# Patient Record
Sex: Female | Born: 1990 | Race: Black or African American | Hispanic: No | Marital: Single | Smoking: Never smoker
Health system: Southern US, Community
[De-identification: ages and names within clinical notes are randomized; demographics above are authoritative.]

## PROBLEM LIST (undated history)

## (undated) ENCOUNTER — Inpatient Hospital Stay (HOSPITAL_COMMUNITY): Payer: Self-pay

## (undated) DIAGNOSIS — O24419 Gestational diabetes mellitus in pregnancy, unspecified control: Secondary | ICD-10-CM

## (undated) DIAGNOSIS — D62 Acute posthemorrhagic anemia: Secondary | ICD-10-CM

## (undated) HISTORY — PX: NO PAST SURGERIES: SHX2092

## (undated) HISTORY — PX: TOOTH EXTRACTION: SUR596

---

## 2015-10-26 DIAGNOSIS — N898 Other specified noninflammatory disorders of vagina: Secondary | ICD-10-CM | POA: Diagnosis not present

## 2015-10-26 DIAGNOSIS — Z7689 Persons encountering health services in other specified circumstances: Secondary | ICD-10-CM | POA: Diagnosis not present

## 2015-10-26 DIAGNOSIS — A749 Chlamydial infection, unspecified: Secondary | ICD-10-CM | POA: Diagnosis not present

## 2015-10-26 DIAGNOSIS — N760 Acute vaginitis: Secondary | ICD-10-CM | POA: Diagnosis not present

## 2015-10-26 DIAGNOSIS — Z01419 Encounter for gynecological examination (general) (routine) without abnormal findings: Secondary | ICD-10-CM | POA: Diagnosis not present

## 2015-10-26 DIAGNOSIS — Z3201 Encounter for pregnancy test, result positive: Secondary | ICD-10-CM | POA: Diagnosis not present

## 2015-10-26 DIAGNOSIS — Z124 Encounter for screening for malignant neoplasm of cervix: Secondary | ICD-10-CM | POA: Diagnosis not present

## 2015-10-26 DIAGNOSIS — B9689 Other specified bacterial agents as the cause of diseases classified elsewhere: Secondary | ICD-10-CM | POA: Diagnosis not present

## 2015-10-26 DIAGNOSIS — Z3A01 Less than 8 weeks gestation of pregnancy: Secondary | ICD-10-CM | POA: Diagnosis not present

## 2015-10-26 DIAGNOSIS — Z7251 High risk heterosexual behavior: Secondary | ICD-10-CM | POA: Diagnosis not present

## 2015-10-26 DIAGNOSIS — Z6841 Body Mass Index (BMI) 40.0 and over, adult: Secondary | ICD-10-CM | POA: Diagnosis not present

## 2015-11-05 DIAGNOSIS — Z3201 Encounter for pregnancy test, result positive: Secondary | ICD-10-CM | POA: Diagnosis not present

## 2015-11-23 DIAGNOSIS — Z36 Encounter for antenatal screening of mother: Secondary | ICD-10-CM | POA: Diagnosis not present

## 2015-11-23 DIAGNOSIS — Z3401 Encounter for supervision of normal first pregnancy, first trimester: Secondary | ICD-10-CM | POA: Diagnosis not present

## 2015-12-30 DIAGNOSIS — Z36 Encounter for antenatal screening of mother: Secondary | ICD-10-CM | POA: Diagnosis not present

## 2016-01-12 DIAGNOSIS — O26892 Other specified pregnancy related conditions, second trimester: Secondary | ICD-10-CM | POA: Diagnosis not present

## 2016-01-12 DIAGNOSIS — R1084 Generalized abdominal pain: Secondary | ICD-10-CM | POA: Diagnosis not present

## 2016-01-12 DIAGNOSIS — R300 Dysuria: Secondary | ICD-10-CM | POA: Diagnosis not present

## 2016-01-17 DIAGNOSIS — R0789 Other chest pain: Secondary | ICD-10-CM | POA: Diagnosis not present

## 2016-01-17 DIAGNOSIS — S01512A Laceration without foreign body of oral cavity, initial encounter: Secondary | ICD-10-CM | POA: Diagnosis not present

## 2016-01-17 DIAGNOSIS — Z3A19 19 weeks gestation of pregnancy: Secondary | ICD-10-CM | POA: Diagnosis not present

## 2016-01-17 DIAGNOSIS — O9A212 Injury, poisoning and certain other consequences of external causes complicating pregnancy, second trimester: Secondary | ICD-10-CM | POA: Diagnosis not present

## 2016-01-17 DIAGNOSIS — M545 Low back pain: Secondary | ICD-10-CM | POA: Diagnosis not present

## 2016-01-17 DIAGNOSIS — O9989 Other specified diseases and conditions complicating pregnancy, childbirth and the puerperium: Secondary | ICD-10-CM | POA: Diagnosis not present

## 2016-01-17 DIAGNOSIS — Z6841 Body Mass Index (BMI) 40.0 and over, adult: Secondary | ICD-10-CM | POA: Diagnosis not present

## 2016-01-29 DIAGNOSIS — Z3402 Encounter for supervision of normal first pregnancy, second trimester: Secondary | ICD-10-CM | POA: Diagnosis not present

## 2016-01-29 DIAGNOSIS — Z36 Encounter for antenatal screening of mother: Secondary | ICD-10-CM | POA: Diagnosis not present

## 2016-02-12 DIAGNOSIS — Z36 Encounter for antenatal screening of mother: Secondary | ICD-10-CM | POA: Diagnosis not present

## 2016-02-23 DIAGNOSIS — Z3A23 23 weeks gestation of pregnancy: Secondary | ICD-10-CM | POA: Diagnosis not present

## 2016-02-23 DIAGNOSIS — O368990 Maternal care for other specified fetal problems, unspecified trimester, not applicable or unspecified: Secondary | ICD-10-CM | POA: Diagnosis not present

## 2016-03-30 DIAGNOSIS — Z36 Encounter for antenatal screening of mother: Secondary | ICD-10-CM | POA: Diagnosis not present

## 2016-04-01 DIAGNOSIS — Z3A29 29 weeks gestation of pregnancy: Secondary | ICD-10-CM | POA: Diagnosis not present

## 2016-04-01 DIAGNOSIS — O3663X0 Maternal care for excessive fetal growth, third trimester, not applicable or unspecified: Secondary | ICD-10-CM | POA: Diagnosis not present

## 2016-04-06 DIAGNOSIS — Z3A30 30 weeks gestation of pregnancy: Secondary | ICD-10-CM | POA: Diagnosis not present

## 2016-04-06 DIAGNOSIS — O99810 Abnormal glucose complicating pregnancy: Secondary | ICD-10-CM | POA: Diagnosis not present

## 2016-04-11 ENCOUNTER — Encounter: Payer: 59 | Attending: Obstetrics and Gynecology | Admitting: Skilled Nursing Facility1

## 2016-04-11 VITALS — Ht 60.0 in | Wt 223.0 lb

## 2016-04-11 DIAGNOSIS — O24410 Gestational diabetes mellitus in pregnancy, diet controlled: Secondary | ICD-10-CM

## 2016-04-11 DIAGNOSIS — Z713 Dietary counseling and surveillance: Secondary | ICD-10-CM | POA: Diagnosis not present

## 2016-04-12 ENCOUNTER — Encounter: Payer: Self-pay | Admitting: Skilled Nursing Facility1

## 2016-04-12 NOTE — Progress Notes (Signed)
  Patient was seen on 04/12/2016 for Gestational Diabetes self-management class at the [facility]. The following learning objectives were met by the patient during this course:   States the definition of Gestational Diabetes  States why dietary management is important in controlling blood glucose  Describes the effects each nutrient has on blood glucose levels  Demonstrates ability to create a balanced meal plan  Demonstrates carbohydrate counting   States when to check blood glucose levels involving a total of 4 separate occurences in a day  Demonstrates proper blood glucose monitoring techniques  States the effect of stress and exercise on blood glucose levels  States the importance of limiting caffeine and abstaining from alcohol and smoking  Demonstrates the knowledge the glucometer provided in class may not be covered by their insurance and to call their insurance provider immediately after class to know which glucometer their insurance provider does cover as well as calling their physician the next day for a prescription to the glucometer their insurance does cover (if the one provided is not) as well as the lancets and strips for that meter.  Blood glucose monitor given: true metrix Lot # [id number] Exp: 10/26/2016 Blood glucose reading: WNL fasting  Patient instructed to monitor glucose levels: FBS: 60 - <90 1 hour: <140 2 hour: <120  *Patient received handouts:  Nutrition Diabetes and Pregnancy  Carbohydrate Counting List  Patient will be seen for follow-up as needed.

## 2016-04-24 ENCOUNTER — Encounter (HOSPITAL_COMMUNITY): Payer: Self-pay

## 2016-04-24 ENCOUNTER — Inpatient Hospital Stay (HOSPITAL_COMMUNITY)
Admission: AD | Admit: 2016-04-24 | Discharge: 2016-04-24 | Disposition: A | Discharge status: Home / Self Care | Payer: 59 | Source: Ambulatory Visit | Attending: Obstetrics and Gynecology | Admitting: Obstetrics and Gynecology

## 2016-04-24 DIAGNOSIS — R55 Syncope and collapse: Secondary | ICD-10-CM | POA: Insufficient documentation

## 2016-04-24 DIAGNOSIS — O133 Gestational [pregnancy-induced] hypertension without significant proteinuria, third trimester: Secondary | ICD-10-CM | POA: Insufficient documentation

## 2016-04-24 DIAGNOSIS — Z3A32 32 weeks gestation of pregnancy: Secondary | ICD-10-CM | POA: Insufficient documentation

## 2016-04-24 DIAGNOSIS — O24419 Gestational diabetes mellitus in pregnancy, unspecified control: Secondary | ICD-10-CM | POA: Diagnosis not present

## 2016-04-24 HISTORY — DX: Gestational diabetes mellitus in pregnancy, unspecified control: O24.419

## 2016-04-24 LAB — COMPREHENSIVE METABOLIC PANEL
ALK PHOS: 87 U/L (ref 38–126)
ALT: 14 U/L (ref 14–54)
AST: 19 U/L (ref 15–41)
Albumin: 2.9 g/dL — ABNORMAL LOW (ref 3.5–5.0)
Anion gap: 9 (ref 5–15)
BUN: 7 mg/dL (ref 6–20)
CALCIUM: 9.3 mg/dL (ref 8.9–10.3)
CO2: 21 mmol/L — ABNORMAL LOW (ref 22–32)
CREATININE: 0.48 mg/dL (ref 0.44–1.00)
Chloride: 103 mmol/L (ref 101–111)
Glucose, Bld: 116 mg/dL — ABNORMAL HIGH (ref 65–99)
Potassium: 3.7 mmol/L (ref 3.5–5.1)
Sodium: 133 mmol/L — ABNORMAL LOW (ref 135–145)
TOTAL PROTEIN: 6.5 g/dL (ref 6.5–8.1)
Total Bilirubin: 0.5 mg/dL (ref 0.3–1.2)

## 2016-04-24 LAB — CBC
HCT: 31.2 % — ABNORMAL LOW (ref 36.0–46.0)
HEMOGLOBIN: 10.2 g/dL — AB (ref 12.0–15.0)
MCH: 27.5 pg (ref 26.0–34.0)
MCHC: 32.7 g/dL (ref 30.0–36.0)
MCV: 84.1 fL (ref 78.0–100.0)
PLATELETS: 349 10*3/uL (ref 150–400)
RBC: 3.71 MIL/uL — AB (ref 3.87–5.11)
RDW: 13.9 % (ref 11.5–15.5)
WBC: 10.6 10*3/uL — ABNORMAL HIGH (ref 4.0–10.5)

## 2016-04-24 LAB — URINALYSIS, ROUTINE W REFLEX MICROSCOPIC
Bilirubin Urine: NEGATIVE
Glucose, UA: 250 mg/dL — AB
HGB URINE DIPSTICK: NEGATIVE
Ketones, ur: 15 mg/dL — AB
Leukocytes, UA: NEGATIVE
NITRITE: NEGATIVE
PROTEIN: 30 mg/dL — AB
Specific Gravity, Urine: 1.025 (ref 1.005–1.030)
pH: 6 (ref 5.0–8.0)

## 2016-04-24 LAB — URINE MICROSCOPIC-ADD ON: RBC / HPF: NONE SEEN RBC/hpf (ref 0–5)

## 2016-04-24 LAB — PROTEIN / CREATININE RATIO, URINE
CREATININE, URINE: 255 mg/dL
Protein Creatinine Ratio: 0.17 mg/mg{Cre} — ABNORMAL HIGH (ref 0.00–0.15)
TOTAL PROTEIN, URINE: 43 mg/dL

## 2016-04-24 LAB — GLUCOSE, CAPILLARY: Glucose-Capillary: 118 mg/dL — ABNORMAL HIGH (ref 65–99)

## 2016-04-24 NOTE — MAU Provider Note (Signed)
History     Chief Complaint  Patient presents with  . Loss of Consciousness   25 yo G1P0 BF @ 32 4/7 weeks presents witnessed  LOC x 1 min today while standing up in church. Per pt's family member, her eyes were still open. She denies any h/a, visual changes, palpitation, seizure activity or loss of urine/stool. Good FM. Denies abdominal trauma. Pt reports prior hx of LOC while not pregnant with negative workup including EEG per pt. Pt checked her surgery and it was 129. PNC complicated by Class a1 GDM  OB History    Gravida Para Term Preterm AB Living   1             SAB TAB Ectopic Multiple Live Births                  Past Medical History:  Diagnosis Date  . Gestational diabetes mellitus, antepartum, unspecified diabetic control, unspecified trimester     Past Surgical History:  Procedure Laterality Date  . TOOTH EXTRACTION      History reviewed. No pertinent family history.  Social History  Substance Use Topics  . Smoking status: Never Smoker  . Smokeless tobacco: Never Used  . Alcohol use No    Allergies: Not on File  No prescriptions prior to admission.     Physical Exam   Blood pressure 145/87, pulse 107, temperature 97.5 F (36.4 C), temperature source Oral, resp. rate 16.  General appearance: alert, cooperative and no distress Lungs: clear to auscultation bilaterally Heart: tachycardia. Grade 2/6 SEM Abdomen: gravid soft non tender Pelvic: deferred Extremities: edema tr- 1+ Pulses: 2+ and symmetric Skin: Skin color, texture, turgor normal. No rashes or lesions   Tracing: baseline 125 (+) accel  IMP: LOC prob vasovagal  Gest HTN IUP @ 32 4/7 weeks GDM clss a1 P) ekg, pih labs.NST ED Course   Addendum: tracing: baseline 125 (+) accel to 150 No ctx  EKG: sinus tachycardia CBC Latest Ref Rng & Units 04/24/2016  WBC 4.0 - 10.5 K/uL 10.6(H)  Hemoglobin 12.0 - 15.0 g/dL 10.2(L)  Hematocrit 36.0 - 46.0 % 31.2(L)  Platelets 150 - 400 K/uL 349    CMP Latest Ref Rng & Units 04/24/2016  Glucose 65 - 99 mg/dL 220(U)  BUN 6 - 20 mg/dL 7  Creatinine 5.42 - 7.06 mg/dL 2.37  Sodium 628 - 315 mmol/L 133(L)  Potassium 3.5 - 5.1 mmol/L 3.7  Chloride 101 - 111 mmol/L 103  CO2 22 - 32 mmol/L 21(L)  Calcium 8.9 - 10.3 mg/dL 9.3  Total Protein 6.5 - 8.1 g/dL 6.5  Total Bilirubin 0.3 - 1.2 mg/dL 0.5  Alkaline Phos 38 - 126 U/L 87  AST 15 - 41 U/L 19  ALT 14 - 54 U/L 14  protein/creatinine ratio 0.17  MDM   Gatlyn Lipari A, MD 3:38 PM 04/24/2016

## 2016-04-24 NOTE — MAU Note (Signed)
Pt was at home, stood up and got light headed and passed out.  Friends say she did fall but did not hit her abdomen.

## 2016-05-05 DIAGNOSIS — O24410 Gestational diabetes mellitus in pregnancy, diet controlled: Secondary | ICD-10-CM | POA: Diagnosis not present

## 2016-05-05 DIAGNOSIS — Z3A34 34 weeks gestation of pregnancy: Secondary | ICD-10-CM | POA: Diagnosis not present

## 2016-05-12 DIAGNOSIS — O24414 Gestational diabetes mellitus in pregnancy, insulin controlled: Secondary | ICD-10-CM | POA: Diagnosis not present

## 2016-05-12 DIAGNOSIS — Z3A35 35 weeks gestation of pregnancy: Secondary | ICD-10-CM | POA: Diagnosis not present

## 2016-05-18 DIAGNOSIS — Z3A36 36 weeks gestation of pregnancy: Secondary | ICD-10-CM | POA: Diagnosis not present

## 2016-05-18 DIAGNOSIS — O24414 Gestational diabetes mellitus in pregnancy, insulin controlled: Secondary | ICD-10-CM | POA: Diagnosis not present

## 2016-05-24 DIAGNOSIS — Z3A36 36 weeks gestation of pregnancy: Secondary | ICD-10-CM | POA: Diagnosis not present

## 2016-05-24 DIAGNOSIS — O24414 Gestational diabetes mellitus in pregnancy, insulin controlled: Secondary | ICD-10-CM | POA: Diagnosis not present

## 2016-05-27 DIAGNOSIS — Z3A37 37 weeks gestation of pregnancy: Secondary | ICD-10-CM | POA: Diagnosis not present

## 2016-05-27 DIAGNOSIS — O24414 Gestational diabetes mellitus in pregnancy, insulin controlled: Secondary | ICD-10-CM | POA: Diagnosis not present

## 2016-06-01 DIAGNOSIS — Z3A38 38 weeks gestation of pregnancy: Secondary | ICD-10-CM | POA: Diagnosis not present

## 2016-06-01 DIAGNOSIS — O24414 Gestational diabetes mellitus in pregnancy, insulin controlled: Secondary | ICD-10-CM | POA: Diagnosis not present

## 2016-06-06 ENCOUNTER — Encounter (HOSPITAL_COMMUNITY): Payer: Self-pay

## 2016-06-06 ENCOUNTER — Inpatient Hospital Stay (HOSPITAL_COMMUNITY)
Admission: AD | Admit: 2016-06-06 | Discharge: 2016-06-11 | DRG: 765 | Disposition: A | Discharge status: Home / Self Care | Payer: 59 | Source: Ambulatory Visit | Attending: Obstetrics and Gynecology | Admitting: Obstetrics and Gynecology

## 2016-06-06 ENCOUNTER — Inpatient Hospital Stay (HOSPITAL_COMMUNITY): Payer: 59

## 2016-06-06 DIAGNOSIS — Z3A38 38 weeks gestation of pregnancy: Secondary | ICD-10-CM

## 2016-06-06 DIAGNOSIS — O99214 Obesity complicating childbirth: Secondary | ICD-10-CM | POA: Diagnosis present

## 2016-06-06 DIAGNOSIS — D62 Acute posthemorrhagic anemia: Secondary | ICD-10-CM | POA: Diagnosis not present

## 2016-06-06 DIAGNOSIS — O134 Gestational [pregnancy-induced] hypertension without significant proteinuria, complicating childbirth: Secondary | ICD-10-CM | POA: Diagnosis present

## 2016-06-06 DIAGNOSIS — O9081 Anemia of the puerperium: Secondary | ICD-10-CM | POA: Diagnosis not present

## 2016-06-06 DIAGNOSIS — O24424 Gestational diabetes mellitus in childbirth, insulin controlled: Secondary | ICD-10-CM | POA: Diagnosis not present

## 2016-06-06 DIAGNOSIS — L2989 Other pruritus: Secondary | ICD-10-CM | POA: Diagnosis not present

## 2016-06-06 DIAGNOSIS — Z6841 Body Mass Index (BMI) 40.0 and over, adult: Secondary | ICD-10-CM | POA: Diagnosis not present

## 2016-06-06 DIAGNOSIS — O4103X0 Oligohydramnios, third trimester, not applicable or unspecified: Principal | ICD-10-CM | POA: Diagnosis present

## 2016-06-06 DIAGNOSIS — O99824 Streptococcus B carrier state complicating childbirth: Secondary | ICD-10-CM | POA: Diagnosis present

## 2016-06-06 DIAGNOSIS — Z3A39 39 weeks gestation of pregnancy: Secondary | ICD-10-CM | POA: Diagnosis not present

## 2016-06-06 DIAGNOSIS — O24425 Gestational diabetes mellitus in childbirth, controlled by oral hypoglycemic drugs: Secondary | ICD-10-CM | POA: Diagnosis present

## 2016-06-06 DIAGNOSIS — O139 Gestational [pregnancy-induced] hypertension without significant proteinuria, unspecified trimester: Secondary | ICD-10-CM | POA: Diagnosis present

## 2016-06-06 DIAGNOSIS — O24419 Gestational diabetes mellitus in pregnancy, unspecified control: Secondary | ICD-10-CM | POA: Diagnosis present

## 2016-06-06 DIAGNOSIS — T50905A Adverse effect of unspecified drugs, medicaments and biological substances, initial encounter: Secondary | ICD-10-CM

## 2016-06-06 DIAGNOSIS — O368130 Decreased fetal movements, third trimester, not applicable or unspecified: Secondary | ICD-10-CM | POA: Diagnosis not present

## 2016-06-06 DIAGNOSIS — O24414 Gestational diabetes mellitus in pregnancy, insulin controlled: Secondary | ICD-10-CM | POA: Diagnosis not present

## 2016-06-06 DIAGNOSIS — O4100X0 Oligohydramnios, unspecified trimester, not applicable or unspecified: Secondary | ICD-10-CM | POA: Diagnosis present

## 2016-06-06 DIAGNOSIS — L298 Other pruritus: Secondary | ICD-10-CM | POA: Diagnosis not present

## 2016-06-06 HISTORY — DX: Acute posthemorrhagic anemia: D62

## 2016-06-06 LAB — CBC
HCT: 31.9 % — ABNORMAL LOW (ref 36.0–46.0)
Hemoglobin: 10.6 g/dL — ABNORMAL LOW (ref 12.0–15.0)
MCH: 26.2 pg (ref 26.0–34.0)
MCHC: 33.2 g/dL (ref 30.0–36.0)
MCV: 78.8 fL (ref 78.0–100.0)
PLATELETS: 333 10*3/uL (ref 150–400)
RBC: 4.05 MIL/uL (ref 3.87–5.11)
RDW: 15.9 % — ABNORMAL HIGH (ref 11.5–15.5)
WBC: 10.5 10*3/uL (ref 4.0–10.5)

## 2016-06-06 LAB — OB RESULTS CONSOLE ABO/RH: RH TYPE: POSITIVE

## 2016-06-06 LAB — OB RESULTS CONSOLE HEPATITIS B SURFACE ANTIGEN: HEP B S AG: NEGATIVE

## 2016-06-06 LAB — OB RESULTS CONSOLE GBS: GBS: POSITIVE

## 2016-06-06 LAB — OB RESULTS CONSOLE RUBELLA ANTIBODY, IGM: RUBELLA: IMMUNE

## 2016-06-06 LAB — OB RESULTS CONSOLE RPR: RPR: NONREACTIVE

## 2016-06-06 LAB — OB RESULTS CONSOLE GC/CHLAMYDIA
Chlamydia: NEGATIVE
Gonorrhea: NEGATIVE

## 2016-06-06 LAB — OB RESULTS CONSOLE HIV ANTIBODY (ROUTINE TESTING): HIV: NONREACTIVE

## 2016-06-06 LAB — OB RESULTS CONSOLE ANTIBODY SCREEN: Antibody Screen: NEGATIVE

## 2016-06-06 LAB — GLUCOSE, RANDOM: Glucose, Bld: 105 mg/dL — ABNORMAL HIGH (ref 65–99)

## 2016-06-06 MED ORDER — ACETAMINOPHEN 325 MG PO TABS: 650.0000 mg | ORAL_TABLET | ORAL | Status: DC | PRN

## 2016-06-06 MED ORDER — OXYCODONE-ACETAMINOPHEN 5-325 MG PO TABS
2.0000 | ORAL_TABLET | ORAL | Status: DC | PRN
Start: 2016-06-06 — End: 2016-06-08

## 2016-06-06 MED ORDER — SOD CITRATE-CITRIC ACID 500-334 MG/5ML PO SOLN
30.0000 mL | ORAL | Status: DC | PRN
  Administered 2016-06-08: 30 mL via ORAL
  Filled 2016-06-06: qty 15

## 2016-06-06 MED ORDER — OXYTOCIN 40 UNITS IN LACTATED RINGERS INFUSION - SIMPLE MED: 2.5000 [IU]/h | INTRAVENOUS | Status: DC

## 2016-06-06 MED ORDER — OXYTOCIN 10 UNIT/ML IJ SOLN: 10.0000 [IU] | Freq: Once | INTRAMUSCULAR | Status: DC

## 2016-06-06 MED ORDER — PENICILLIN G POTASSIUM 5000000 UNITS IJ SOLR
5.0000 10*6.[IU] | Freq: Once | INTRAVENOUS | Status: AC
  Administered 2016-06-06: 5 10*6.[IU] via INTRAVENOUS
  Filled 2016-06-06: qty 5

## 2016-06-06 MED ORDER — TERBUTALINE SULFATE 1 MG/ML IJ SOLN
0.2500 mg | Freq: Once | INTRAMUSCULAR | Status: DC | PRN
Start: 2016-06-06 — End: 2016-06-08

## 2016-06-06 MED ORDER — OXYCODONE-ACETAMINOPHEN 5-325 MG PO TABS: 1.0000 | ORAL_TABLET | ORAL | Status: DC | PRN

## 2016-06-06 MED ORDER — LIDOCAINE HCL (PF) 1 % IJ SOLN: 30.0000 mL | INTRAMUSCULAR | Status: DC | PRN

## 2016-06-06 MED ORDER — LACTATED RINGERS IV SOLN: 500.0000 mL | INTRAVENOUS | Status: DC | PRN

## 2016-06-06 MED ORDER — PENICILLIN G POTASSIUM 5000000 UNITS IJ SOLR
2.5000 10*6.[IU] | INTRAVENOUS | Status: DC
  Administered 2016-06-07 – 2016-06-08 (×10): 2.5 10*6.[IU] via INTRAVENOUS
  Filled 2016-06-06 (×13): qty 2.5

## 2016-06-06 MED ORDER — MISOPROSTOL 25 MCG QUARTER TABLET
25.0000 ug | ORAL_TABLET | ORAL | Status: DC | PRN
  Administered 2016-06-06: 25 ug via VAGINAL
  Filled 2016-06-06: qty 0.25

## 2016-06-06 MED ORDER — ONDANSETRON HCL 4 MG/2ML IJ SOLN: 4.0000 mg | Freq: Four times a day (QID) | INTRAMUSCULAR | Status: DC | PRN

## 2016-06-06 MED ORDER — LACTATED RINGERS IV SOLN
INTRAVENOUS | Status: DC
  Administered 2016-06-06 – 2016-06-08 (×8): via INTRAVENOUS

## 2016-06-06 MED ORDER — OXYTOCIN BOLUS FROM INFUSION: 500.0000 mL | Freq: Once | INTRAVENOUS | Status: DC

## 2016-06-06 NOTE — MAU Note (Signed)
Urine in lab 

## 2016-06-06 NOTE — H&P (Signed)
Michelle [name] is a 25 y.o. female presenting for IOL @ 38 5/[redacted] weeks gestation due to newly dx oligohydramnios( AFI 3.1) down from 7.6 cm( 9/6). Denies rupture of membrane. (+) FM. NR NST. BPP 8/8 PNC also complicated by Class a2 GDM on glyburide  OB History    Gravida Para Term Preterm AB Living   1             SAB TAB Ectopic Multiple Live Births                 Past Medical History:  Diagnosis Date  . Gestational diabetes mellitus, antepartum, unspecified diabetic control, unspecified trimester    Gestational   Past Surgical History:  Procedure Laterality Date  . TOOTH EXTRACTION     Family History: family history is not on file. Social History:  reports that she has never smoked. She has never used smokeless tobacco. She reports that she does not drink alcohol or use drugs.     Maternal Diabetes: Yes:  Diabetes Type:  Insulin/Medication controlled Genetic Screening: Normal Maternal Ultrasounds/Referrals: Normal Fetal Ultrasounds or other Referrals:  None Maternal Substance Abuse:  No Significant Maternal Medications:  Meds include: Other: glyburide Significant Maternal Lab Results:  Lab values include: Group B Strep positive Other Comments:  oligohydramnios  Review of Systems  All other systems reviewed and are negative.  Maternal Medical History:  Contractions: Frequency: rare.    Fetal activity: Perceived fetal activity is normal.    Prenatal complications: Oligohydramnios.   Prenatal Complications - Diabetes: gestational. Diabetes is managed by oral agent (triple therapy).        Blood pressure 142/88, pulse 102, temperature 98 F (36.7 C), temperature source Oral, resp. rate 18, height 5' (1.524 m), weight 106.6 kg (235 lb). Maternal Exam:  Uterine Assessment: Contraction frequency is rare.   Abdomen: Fetal presentation: vertex  Introitus: Normal vulva. Pelvis: adequate for delivery.   Cervix: Cervix evaluated by digital exam.     Physical  Exam  Constitutional: She appears well-nourished.  HENT:  Head: Atraumatic.  Eyes: EOM are normal.  Neck: Neck supple.  Cardiovascular: Regular rhythm.   Respiratory: Breath sounds normal.  GI: Soft.  Musculoskeletal: She exhibits edema.  Neurological: She is alert.  Skin: Skin is warm and dry.  Psychiatric: She has a normal mood and affect.   VE closed/uneffaced/-3 Prenatal labs: ABO, Rh:  A positive Antibody:  neg Rubella:  Immune RPR:   NR HBsAg:  neg  HIV:   neg GBS:  positive   Assessment/Plan: Oligohydramnios Class a2 GDM Term gestation (+) GBS P) admit routine labs. Cytotec. IV PCN. Analgesic prn.  CBG q 2 hrs. Light labor diet   Marline Morace A 06/06/2016, 6:19 PM

## 2016-06-06 NOTE — MAU Note (Signed)
Pt presents to MAU from Dr. Cherly Hensenousins office for a BPP and NST. Pt reports baby was not as active in the NST earlier today. Pt reports she does feel fetal movement. Pt was closed in the office.

## 2016-06-06 NOTE — MAU Provider Note (Signed)
History     Chief Complaint  Patient presents with  . Non-stress Test  cc: 25 yo G1P0 BF with Class A2 GDM on glyburide sent from office for further testing due to NR NST    OB History    Gravida Para Term Preterm AB Living   1             SAB TAB Ectopic Multiple Live Births                  Past Medical History:  Diagnosis Date  . Gestational diabetes mellitus, antepartum, unspecified diabetic control, unspecified trimester    Gestational    Past Surgical History:  Procedure Laterality Date  . TOOTH EXTRACTION      History reviewed. No pertinent family history.  Social History  Substance Use Topics  . Smoking status: Never Smoker  . Smokeless tobacco: Never Used  . Alcohol use No    Allergies:  Allergies  Allergen Reactions  . Ibuprofen Hives    Prescriptions Prior to Admission  Medication Sig Dispense Refill Last Dose  . glyBURIDE (DIABETA) 2.5 MG tablet Take 2.5 mg by mouth 2 (two) times daily with [name] meal.   06/06/2016 at Unknown time  . Prenatal Vit-Fe Fumarate-FA (PRENATAL MULTIVITAMIN) TABS tablet Take 1 tablet by mouth daily at 12 noon.   06/06/2016 at Unknown time     Physical Exam   Blood pressure 142/88, pulse 102, temperature 98 F (36.7 C), temperature source Oral, resp. rate 18, height 5' (1.524 m), weight 106.6 kg (235 lb).  No exam performed today, here for testing.  tracing: baseline 135 (+) accel (+) variable x1 No ctx  sono done: BPP 8/8.AFI 3.2 ED Course  IMP: oligohydramnios Class A2GDM Term gestation  P) admit for IOL  With unfavorable cervix , will do Cytotec. See admit H&P  MDM   Michelle [name] A, MD 6:21 PM 06/06/2016

## 2016-06-07 LAB — GLUCOSE, CAPILLARY
GLUCOSE-CAPILLARY: 134 mg/dL — AB (ref 65–99)
GLUCOSE-CAPILLARY: 98 mg/dL (ref 65–99)
GLUCOSE-CAPILLARY: 88 mg/dL (ref 65–99)
Glucose-Capillary: 97 mg/dL (ref 65–99)
Glucose-Capillary: 83 mg/dL (ref 65–99)

## 2016-06-07 LAB — CBC
HEMATOCRIT: 33.2 % — AB (ref 36.0–46.0)
Hemoglobin: 10.9 g/dL — ABNORMAL LOW (ref 12.0–15.0)
MCH: 26 pg (ref 26.0–34.0)
MCHC: 32.8 g/dL (ref 30.0–36.0)
MCV: 79 fL (ref 78.0–100.0)
Platelets: 335 10*3/uL (ref 150–400)
RBC: 4.2 MIL/uL (ref 3.87–5.11)
RDW: 16 % — AB (ref 11.5–15.5)
WBC: 18.3 10*3/uL — AB (ref 4.0–10.5)

## 2016-06-07 LAB — RPR: RPR: NONREACTIVE

## 2016-06-07 MED ORDER — OXYTOCIN 40 UNITS IN LACTATED RINGERS INFUSION - SIMPLE MED
1.0000 m[IU]/min | INTRAVENOUS | Status: DC
  Administered 2016-06-07: 2 m[IU]/min via INTRAVENOUS
  Administered 2016-06-08: 22 m[IU]/min via INTRAVENOUS
  Filled 2016-06-07 (×2): qty 1000

## 2016-06-07 MED ORDER — PHENYLEPHRINE 40 MCG/ML (10ML) SYRINGE FOR IV PUSH (FOR BLOOD PRESSURE SUPPORT): 80.0000 ug | PREFILLED_SYRINGE | INTRAVENOUS | Status: DC | PRN

## 2016-06-07 MED ORDER — BUTORPHANOL TARTRATE 1 MG/ML IJ SOLN
2.0000 mg | INTRAMUSCULAR | Status: DC | PRN
  Administered 2016-06-07: 2 mg via INTRAVENOUS
  Filled 2016-06-07: qty 2

## 2016-06-07 MED ORDER — EPHEDRINE 5 MG/ML INJ: 10.0000 mg | INTRAVENOUS | Status: DC | PRN

## 2016-06-07 MED ORDER — TERBUTALINE SULFATE 1 MG/ML IJ SOLN: 0.2500 mg | Freq: Once | INTRAMUSCULAR | Status: DC | PRN

## 2016-06-07 MED ORDER — PHENYLEPHRINE 40 MCG/ML (10ML) SYRINGE FOR IV PUSH (FOR BLOOD PRESSURE SUPPORT)
80.0000 ug | PREFILLED_SYRINGE | INTRAVENOUS | Status: DC | PRN
  Filled 2016-06-07: qty 10

## 2016-06-07 MED ORDER — DIPHENHYDRAMINE HCL 50 MG/ML IJ SOLN: 12.5000 mg | INTRAMUSCULAR | Status: DC | PRN

## 2016-06-07 MED ORDER — LACTATED RINGERS IV SOLN
500.0000 mL | Freq: Once | INTRAVENOUS | Status: AC
  Administered 2016-06-08: 500 mL via INTRAVENOUS

## 2016-06-07 MED ORDER — FENTANYL 2.5 MCG/ML BUPIVACAINE 1/10 % EPIDURAL INFUSION (WH - ANES)
14.0000 mL/h | INTRAMUSCULAR | Status: DC | PRN
  Administered 2016-06-08: 14 mL/h via EPIDURAL
  Filled 2016-06-07: qty 125

## 2016-06-07 NOTE — Progress Notes (Signed)
S; did not sleep well S/p cytotec x 1  O; Pitocin  VE ft ext os/long/-3  Attempted balloon placement w/o success  Tracing: baseline 135 (+) accel Ctx q 2-3 mins  IMP: Oligohydramnios GBS cx (+) Class A2 GDM  P)  Cont with pitocin. IV PCN

## 2016-06-07 NOTE — Progress Notes (Signed)
S; drowsy due to stadol  O:  Pitocin 18 miu VE . Balloon in place. Thin cervix  Tracing: baseline 125 (+) accel Ctx q 2-4  BS 86  IMP: oligohydramnios GBS cx (+)  On IV PCN Class A2 GDM P) cont present mgmt.

## 2016-06-07 NOTE — Progress Notes (Signed)
Michelle [name] is a 25 y.o. G1P0 at 6858w6d by ultrasound admitted for induction of labor due to Low amniotic fluid..  Subjective: no complaint. Some cramping Notes some cramping  Objective: Pitocin 14miu  BP 139/90   Pulse 91   Temp 98.3 F (36.8 C) (Oral)   Resp 20   Ht 5' (1.524 m)   Wt 106.6 kg (235 lb)   BMI 45.90 kg/m  No intake/output data recorded. Total I/O In: 240 [P.O.:240] Out: -   FHT:  FHR: 135 bpm, variability: moderate,  accelerations:  Present,  decelerations:  Absent UC:   irregular, every ? frequency minutes  VE tight 1cm/50/-3 IUPC placed CBG (last 3)   Recent Labs  06/07/16 0103 06/07/16 0820 06/07/16 1203  GLUCAP 134* 97 83     Labs: Lab Results  Component Value Date   WBC 10.5 06/06/2016   HGB 10.6 (L) 06/06/2016   HCT 31.9 (L) 06/06/2016   MCV 78.8 06/06/2016   PLT 333 06/06/2016    Assessment / Plan: oligohydramnios  Class A2GDM GBS cx (+) P) cont pitocin. Await foley balloon bag out   Anticipated MOD:  guarded  Marily Konczal A 06/07/2016, 1:51 PM

## 2016-06-08 ENCOUNTER — Encounter (HOSPITAL_COMMUNITY): Payer: Self-pay | Admitting: Anesthesiology

## 2016-06-08 ENCOUNTER — Encounter (HOSPITAL_COMMUNITY)
Admission: AD | Disposition: A | Discharge status: Home / Self Care | Payer: Self-pay | Source: Ambulatory Visit | Attending: Obstetrics and Gynecology

## 2016-06-08 ENCOUNTER — Inpatient Hospital Stay (HOSPITAL_COMMUNITY): Payer: 59 | Admitting: Anesthesiology

## 2016-06-08 LAB — COMPREHENSIVE METABOLIC PANEL
ALBUMIN: 2.8 g/dL — AB (ref 3.5–5.0)
ALT: 13 U/L — ABNORMAL LOW (ref 14–54)
ANION GAP: 7 (ref 5–15)
AST: 19 U/L (ref 15–41)
Alkaline Phosphatase: 133 U/L — ABNORMAL HIGH (ref 38–126)
BILIRUBIN TOTAL: 0.7 mg/dL (ref 0.3–1.2)
BUN: 5 mg/dL — ABNORMAL LOW (ref 6–20)
CHLORIDE: 106 mmol/L (ref 101–111)
CO2: 22 mmol/L (ref 22–32)
Calcium: 9.4 mg/dL (ref 8.9–10.3)
Creatinine, Ser: 0.43 mg/dL — ABNORMAL LOW (ref 0.44–1.00)
GFR calc Af Amer: >60 mL/min (ref >60)
Glucose, Bld: 118 mg/dL — ABNORMAL HIGH (ref 65–99)
POTASSIUM: 3.8 mmol/L (ref 3.5–5.1)
Sodium: 135 mmol/L (ref 135–145)
TOTAL PROTEIN: 6.9 g/dL (ref 6.5–8.1)

## 2016-06-08 LAB — CBC
HCT: 27.5 % — ABNORMAL LOW (ref 36.0–46.0)
HEMATOCRIT: 31.4 % — AB (ref 36.0–46.0)
Hemoglobin: 10.2 g/dL — ABNORMAL LOW (ref 12.0–15.0)
Hemoglobin: 9 g/dL — ABNORMAL LOW (ref 12.0–15.0)
MCH: 25.5 pg — ABNORMAL LOW (ref 26.0–34.0)
MCH: 25.5 pg — AB (ref 26.0–34.0)
MCHC: 32.5 g/dL (ref 30.0–36.0)
MCHC: 32.7 g/dL (ref 30.0–36.0)
MCV: 78.5 fL (ref 78.0–100.0)
MCV: 77.9 fL — AB (ref 78.0–100.0)
PLATELETS: 317 10*3/uL (ref 150–400)
PLATELETS: 232 10*3/uL (ref 150–400)
RBC: 4 MIL/uL (ref 3.87–5.11)
RBC: 3.53 MIL/uL — ABNORMAL LOW (ref 3.87–5.11)
RDW: 16.1 % — AB (ref 11.5–15.5)
RDW: 16.2 % — AB (ref 11.5–15.5)
WBC: 14.4 10*3/uL — ABNORMAL HIGH (ref 4.0–10.5)
WBC: 23.2 10*3/uL — ABNORMAL HIGH (ref 4.0–10.5)

## 2016-06-08 LAB — GLUCOSE, CAPILLARY
GLUCOSE-CAPILLARY: 111 mg/dL — AB (ref 65–99)
GLUCOSE-CAPILLARY: 115 mg/dL — AB (ref 65–99)
GLUCOSE-CAPILLARY: 118 mg/dL — AB (ref 65–99)
GLUCOSE-CAPILLARY: 98 mg/dL (ref 65–99)
Glucose-Capillary: 114 mg/dL — ABNORMAL HIGH (ref 65–99)
Glucose-Capillary: 109 mg/dL — ABNORMAL HIGH (ref 65–99)

## 2016-06-08 LAB — URIC ACID: URIC ACID, SERUM: 5.6 mg/dL (ref 2.3–6.6)

## 2016-06-08 SURGERY — Surgical Case
Anesthesia: Epidural

## 2016-06-08 MED ORDER — NALBUPHINE HCL 10 MG/ML IJ SOLN: 5.0000 mg | Freq: Once | INTRAMUSCULAR | Status: DC | PRN

## 2016-06-08 MED ORDER — BUPIVACAINE HCL (PF) 0.25 % IJ SOLN
INTRAMUSCULAR | Status: DC | PRN
  Administered 2016-06-08: 30 mL

## 2016-06-08 MED ORDER — DEXAMETHASONE SODIUM PHOSPHATE 4 MG/ML IJ SOLN
INTRAMUSCULAR | Status: AC
  Filled 2016-06-08: qty 1

## 2016-06-08 MED ORDER — DIPHENHYDRAMINE HCL 25 MG PO CAPS: 25.0000 mg | ORAL_CAPSULE | Freq: Four times a day (QID) | ORAL | Status: DC | PRN

## 2016-06-08 MED ORDER — MORPHINE SULFATE (PF) 0.5 MG/ML IJ SOLN
INTRAMUSCULAR | Status: AC
  Filled 2016-06-08: qty 10

## 2016-06-08 MED ORDER — SODIUM BICARBONATE 8.4 % IV SOLN
INTRAVENOUS | Status: DC | PRN
  Administered 2016-06-08 (×2): 5 mL via EPIDURAL

## 2016-06-08 MED ORDER — DIPHENHYDRAMINE HCL 50 MG/ML IJ SOLN: 12.5000 mg | INTRAMUSCULAR | Status: DC | PRN

## 2016-06-08 MED ORDER — NALBUPHINE HCL 10 MG/ML IJ SOLN: 5.0000 mg | INTRAMUSCULAR | Status: DC | PRN

## 2016-06-08 MED ORDER — MEPERIDINE HCL 25 MG/ML IJ SOLN: 6.2500 mg | INTRAMUSCULAR | Status: DC | PRN

## 2016-06-08 MED ORDER — OXYTOCIN 10 UNIT/ML IJ SOLN
INTRAMUSCULAR | Status: AC
Start: 2016-06-08 — End: 2016-06-08
  Filled 2016-06-08: qty 4

## 2016-06-08 MED ORDER — SIMETHICONE 80 MG PO CHEW
80.0000 mg | CHEWABLE_TABLET | Freq: Three times a day (TID) | ORAL | Status: DC
  Administered 2016-06-09 – 2016-06-11 (×7): 80 mg via ORAL
  Filled 2016-06-08 (×7): qty 1

## 2016-06-08 MED ORDER — PHENYLEPHRINE 40 MCG/ML (10ML) SYRINGE FOR IV PUSH (FOR BLOOD PRESSURE SUPPORT)
PREFILLED_SYRINGE | INTRAVENOUS | Status: AC
  Filled 2016-06-08: qty 10

## 2016-06-08 MED ORDER — CARBOPROST TROMETHAMINE 250 MCG/ML IM SOLN
INTRAMUSCULAR | Status: DC | PRN
  Administered 2016-06-08: 250 ug via INTRAMUSCULAR

## 2016-06-08 MED ORDER — NALOXONE HCL 2 MG/2ML IJ SOSY: 1.0000 ug/kg/h | PREFILLED_SYRINGE | INTRAMUSCULAR | Status: DC | PRN

## 2016-06-08 MED ORDER — FENTANYL CITRATE (PF) 100 MCG/2ML IJ SOLN
INTRAMUSCULAR | Status: AC
  Filled 2016-06-08: qty 2

## 2016-06-08 MED ORDER — CEFAZOLIN SODIUM-DEXTROSE 2-3 GM-% IV SOLR
INTRAVENOUS | Status: DC | PRN
  Administered 2016-06-08: 2 g via INTRAVENOUS

## 2016-06-08 MED ORDER — DIBUCAINE 1 % RE OINT: 1.0000 "application " | TOPICAL_OINTMENT | RECTAL | Status: DC | PRN

## 2016-06-08 MED ORDER — CARBOPROST TROMETHAMINE 250 MCG/ML IM SOLN: 250.0000 ug | Freq: Once | INTRAMUSCULAR | Status: DC

## 2016-06-08 MED ORDER — SCOPOLAMINE 1 MG/3DAYS TD PT72
MEDICATED_PATCH | TRANSDERMAL | Status: DC | PRN
  Administered 2016-06-08: 1 via TRANSDERMAL

## 2016-06-08 MED ORDER — CEFAZOLIN SODIUM-DEXTROSE 2-4 GM/100ML-% IV SOLN
INTRAVENOUS | Status: AC
  Filled 2016-06-08: qty 100

## 2016-06-08 MED ORDER — ZOLPIDEM TARTRATE 5 MG PO TABS: 5.0000 mg | ORAL_TABLET | Freq: Every evening | ORAL | Status: DC | PRN

## 2016-06-08 MED ORDER — ONDANSETRON HCL 4 MG/2ML IJ SOLN
INTRAMUSCULAR | Status: AC
  Filled 2016-06-08: qty 2

## 2016-06-08 MED ORDER — ONDANSETRON HCL 4 MG/2ML IJ SOLN: 4.0000 mg | Freq: Three times a day (TID) | INTRAMUSCULAR | Status: DC | PRN

## 2016-06-08 MED ORDER — PHENYLEPHRINE 40 MCG/ML (10ML) SYRINGE FOR IV PUSH (FOR BLOOD PRESSURE SUPPORT)
PREFILLED_SYRINGE | INTRAVENOUS | Status: AC
  Filled 2016-06-08: qty 20

## 2016-06-08 MED ORDER — LIDOCAINE HCL (PF) 1 % IJ SOLN
INTRAMUSCULAR | Status: DC | PRN
  Administered 2016-06-08 (×2): 7 mL via EPIDURAL

## 2016-06-08 MED ORDER — SODIUM CHLORIDE 0.9 % IR SOLN
Status: DC | PRN
  Administered 2016-06-08 (×4): 1

## 2016-06-08 MED ORDER — LIDOCAINE-EPINEPHRINE (PF) 2 %-1:200000 IJ SOLN
INTRAMUSCULAR | Status: AC
Start: 2016-06-08 — End: 2016-06-08
  Filled 2016-06-08: qty 20

## 2016-06-08 MED ORDER — ONDANSETRON HCL 4 MG/2ML IJ SOLN: 4.0000 mg | Freq: Once | INTRAMUSCULAR | Status: DC | PRN

## 2016-06-08 MED ORDER — ACETAMINOPHEN 500 MG PO TABS
1000.0000 mg | ORAL_TABLET | Freq: Four times a day (QID) | ORAL | Status: AC
  Administered 2016-06-09 (×4): 1000 mg via ORAL
  Filled 2016-06-08 (×4): qty 2

## 2016-06-08 MED ORDER — DEXAMETHASONE SODIUM PHOSPHATE 4 MG/ML IJ SOLN
INTRAMUSCULAR | Status: DC | PRN
  Administered 2016-06-08: 4 mg via INTRAVENOUS

## 2016-06-08 MED ORDER — SCOPOLAMINE 1 MG/3DAYS TD PT72: 1.0000 | MEDICATED_PATCH | Freq: Once | TRANSDERMAL | Status: DC

## 2016-06-08 MED ORDER — MORPHINE SULFATE (PF) 0.5 MG/ML IJ SOLN
INTRAMUSCULAR | Status: DC | PRN
  Administered 2016-06-08: 3 mg via EPIDURAL

## 2016-06-08 MED ORDER — BUPIVACAINE HCL (PF) 0.25 % IJ SOLN
INTRAMUSCULAR | Status: AC
  Filled 2016-06-08: qty 30

## 2016-06-08 MED ORDER — OXYCODONE-ACETAMINOPHEN 5-325 MG PO TABS: 2.0000 | ORAL_TABLET | ORAL | Status: DC | PRN

## 2016-06-08 MED ORDER — SIMETHICONE 80 MG PO CHEW: 80.0000 mg | CHEWABLE_TABLET | ORAL | Status: DC | PRN

## 2016-06-08 MED ORDER — MENTHOL 3 MG MT LOZG: 1.0000 | LOZENGE | OROMUCOSAL | Status: DC | PRN

## 2016-06-08 MED ORDER — WITCH HAZEL-GLYCERIN EX PADS: 1.0000 "application " | MEDICATED_PAD | CUTANEOUS | Status: DC | PRN

## 2016-06-08 MED ORDER — NALOXONE HCL 0.4 MG/ML IJ SOLN: 0.4000 mg | INTRAMUSCULAR | Status: DC | PRN

## 2016-06-08 MED ORDER — SODIUM CHLORIDE 0.9% FLUSH: 3.0000 mL | INTRAVENOUS | Status: DC | PRN

## 2016-06-08 MED ORDER — COCONUT OIL OIL: 1.0000 "application " | TOPICAL_OIL | Status: DC | PRN

## 2016-06-08 MED ORDER — OXYTOCIN 10 UNIT/ML IJ SOLN
INTRAVENOUS | Status: DC | PRN
  Administered 2016-06-08: 40 [IU] via INTRAVENOUS

## 2016-06-08 MED ORDER — OXYTOCIN 40 UNITS IN LACTATED RINGERS INFUSION - SIMPLE MED: 2.5000 [IU]/h | INTRAVENOUS | Status: DC

## 2016-06-08 MED ORDER — SENNOSIDES-DOCUSATE SODIUM 8.6-50 MG PO TABS
2.0000 | ORAL_TABLET | ORAL | Status: DC
  Administered 2016-06-09 – 2016-06-10 (×3): 2 via ORAL
  Filled 2016-06-08 (×3): qty 2

## 2016-06-08 MED ORDER — PHENYLEPHRINE 40 MCG/ML (10ML) SYRINGE FOR IV PUSH (FOR BLOOD PRESSURE SUPPORT)
PREFILLED_SYRINGE | INTRAVENOUS | Status: AC
  Filled 2016-06-08: qty 30

## 2016-06-08 MED ORDER — OXYCODONE-ACETAMINOPHEN 5-325 MG PO TABS
1.0000 | ORAL_TABLET | ORAL | Status: DC | PRN
  Administered 2016-06-10 – 2016-06-11 (×4): 1 via ORAL
  Filled 2016-06-08 (×4): qty 1

## 2016-06-08 MED ORDER — ONDANSETRON HCL 4 MG/2ML IJ SOLN
INTRAMUSCULAR | Status: DC | PRN
  Administered 2016-06-08: 4 mg via INTRAVENOUS

## 2016-06-08 MED ORDER — PHENYLEPHRINE HCL 10 MG/ML IJ SOLN
INTRAMUSCULAR | Status: DC | PRN
  Administered 2016-06-08 (×4): 80 ug via INTRAVENOUS
  Administered 2016-06-08 (×3): 40 ug via INTRAVENOUS
  Administered 2016-06-08: 80 ug via INTRAVENOUS
  Administered 2016-06-08: 40 ug via INTRAVENOUS

## 2016-06-08 MED ORDER — SIMETHICONE 80 MG PO CHEW
80.0000 mg | CHEWABLE_TABLET | ORAL | Status: DC
  Administered 2016-06-09 – 2016-06-10 (×2): 80 mg via ORAL
  Filled 2016-06-08 (×3): qty 1

## 2016-06-08 MED ORDER — DIPHENHYDRAMINE HCL 25 MG PO CAPS: 25.0000 mg | ORAL_CAPSULE | ORAL | Status: DC | PRN

## 2016-06-08 MED ORDER — LACTATED RINGERS IV SOLN
INTRAVENOUS | Status: DC | PRN
  Administered 2016-06-08 (×2): via INTRAVENOUS

## 2016-06-08 MED ORDER — LACTATED RINGERS IV SOLN
INTRAVENOUS | Status: DC
  Administered 2016-06-09: 01:00:00 via INTRAVENOUS

## 2016-06-08 MED ORDER — FENTANYL CITRATE (PF) 100 MCG/2ML IJ SOLN
25.0000 ug | INTRAMUSCULAR | Status: DC | PRN
Start: 2016-06-08 — End: 2016-06-08
  Administered 2016-06-08: 25 ug via INTRAVENOUS

## 2016-06-08 MED ORDER — PRENATAL MULTIVITAMIN CH
1.0000 | ORAL_TABLET | Freq: Every day | ORAL | Status: DC
  Administered 2016-06-09 – 2016-06-10 (×2): 1 via ORAL
  Filled 2016-06-08 (×3): qty 1

## 2016-06-08 SURGICAL SUPPLY — 43 items
BARRIER ADHS 3X4 INTERCEED (GAUZE/BANDAGES/DRESSINGS) ×2 IMPLANT
BENZOIN TINCTURE PRP APPL 2/3 (GAUZE/BANDAGES/DRESSINGS) ×2 IMPLANT
CHLORAPREP W/TINT 26ML (MISCELLANEOUS) ×2 IMPLANT
CLAMP CORD UMBIL (MISCELLANEOUS) IMPLANT
CLOSURE STERI STRIP 1/2 X4 (GAUZE/BANDAGES/DRESSINGS) ×2 IMPLANT
CLOTH BEACON ORANGE TIMEOUT ST (SAFETY) ×2 IMPLANT
CONTAINER PREFILL 10% NBF 15ML (MISCELLANEOUS) IMPLANT
DRAPE C SECTION CLR SCREEN (DRAPES) ×2 IMPLANT
DRSG OPSITE POSTOP 4X10 (GAUZE/BANDAGES/DRESSINGS) ×2 IMPLANT
ELECT REM PT RETURN 9FT ADLT (ELECTROSURGICAL) ×2
ELECTRODE REM PT RTRN 9FT ADLT (ELECTROSURGICAL) ×1 IMPLANT
EXTRACTOR VACUUM M CUP 4 TUBE (SUCTIONS) IMPLANT
GLOVE BIOGEL PI IND STRL 7.0 (GLOVE) ×2 IMPLANT
GLOVE BIOGEL PI INDICATOR 7.0 (GLOVE) ×2
GLOVE ECLIPSE 6.5 STRL STRAW (GLOVE) ×2 IMPLANT
GOWN STRL REUS W/TWL LRG LVL3 (GOWN DISPOSABLE) ×4 IMPLANT
KIT ABG SYR 3ML LUER SLIP (SYRINGE) IMPLANT
NEEDLE HYPO 22GX1.5 SAFETY (NEEDLE) ×2 IMPLANT
NEEDLE HYPO 25X5/8 SAFETYGLIDE (NEEDLE) IMPLANT
NS IRRIG 1000ML POUR BTL (IV SOLUTION) ×2 IMPLANT
PACK C SECTION WH (CUSTOM PROCEDURE TRAY) ×2 IMPLANT
PAD OB MATERNITY 4.3X12.25 (PERSONAL CARE ITEMS) ×2 IMPLANT
RTRCTR C-SECT PINK 25CM LRG (MISCELLANEOUS) IMPLANT
STRIP CLOSURE SKIN 1/2X4 (GAUZE/BANDAGES/DRESSINGS) IMPLANT
SUT CHROMIC GUT AB #0 18 (SUTURE) IMPLANT
SUT MNCRL 0 VIOLET CTX 36 (SUTURE) ×3 IMPLANT
SUT MON AB 2-0 SH 27 (SUTURE)
SUT MON AB 2-0 SH27 (SUTURE) IMPLANT
SUT MON AB 3-0 SH 27 (SUTURE)
SUT MON AB 3-0 SH27 (SUTURE) IMPLANT
SUT MON AB 4-0 PS1 27 (SUTURE) IMPLANT
SUT MONOCRYL 0 CTX 36 (SUTURE) ×3
SUT PLAIN 2 0 (SUTURE)
SUT PLAIN 2 0 XLH (SUTURE) IMPLANT
SUT PLAIN ABS 2-0 CT1 27XMFL (SUTURE) IMPLANT
SUT VIC AB 0 CT1 36 (SUTURE) ×4 IMPLANT
SUT VIC AB 2-0 CT1 27 (SUTURE) ×1
SUT VIC AB 2-0 CT1 TAPERPNT 27 (SUTURE) ×1 IMPLANT
SUT VIC AB 4-0 KS 27 (SUTURE) ×2 IMPLANT
SUT VIC AB 4-0 PS2 27 (SUTURE) IMPLANT
SYR CONTROL 10ML LL (SYRINGE) ×2 IMPLANT
TOWEL OR 17X24 6PK STRL BLUE (TOWEL DISPOSABLE) ×2 IMPLANT
TRAY FOLEY CATH SILVER 14FR (SET/KITS/TRAYS/PACK) IMPLANT

## 2016-06-08 NOTE — Progress Notes (Signed)
Kittie PlaterSarah Roberto Hlavaty PACU RN

## 2016-06-08 NOTE — Progress Notes (Signed)
Michelle [name] is a 25 y.o. G1P0 at 9269w0d by ultrasound admitted for induction of labor due to Low amniotic fluid. and Gestational diabetes.  Subjective:  No complaint  Pitocin Balloon out @ 6 am per pt Objective: BP (!) 169/99   Pulse 95   Temp 98 F (36.7 C) (Oral)   Resp 17   Ht 5' (1.524 m)   Wt 106.6 kg (235 lb)   BMI 45.90 kg/m  I/O last 3 completed shifts: In: 240 [P.O.:240] Out: -  No intake/output data recorded.  FHT:  FHR: 135 bpm, variability: moderate,  accelerations:  Present,  decelerations:  Absent UC:   Ctx q 2-4 mins SVE:   4/50/-3 intact Tracing: cat 1  Labs: Lab Results  Component Value Date   WBC 18.3 (H) 06/07/2016   HGB 10.9 (L) 06/07/2016   HCT 33.2 (L) 06/07/2016   MCV 79.0 06/07/2016   PLT 335 06/07/2016  BS115  Assessment / Plan: Oligohydramnios Class A2 GDM Term gestation GBS cx (+) Gestational HTN P) cont pitocin. Left exaggerated sims. Defer amniotomy PIH labs, BS q 2 hrs  Anticipated MOD:  guarded  Michelle [name] A 06/08/2016, 8:51 AM

## 2016-06-08 NOTE — Progress Notes (Signed)
S: sleeping  O: Pitocin Epidural VE 3/edematous/-3  Tracing baseline 135 Ctx undulating  IMP: arrest of dilation Gestational HTN Class A2 GDM GBS cx (+)  Term P) recommend C/S. Risk of surgery reviewed including Infection, bleeding, injury to surrounding organ structures, internal scar tissue. All ? Answered. OR notified. Pt agrees with plan

## 2016-06-08 NOTE — Anesthesia Preprocedure Evaluation (Signed)
Anesthesia Evaluation  Patient identified by MRN, date of birth, ID band Patient awake    Reviewed: Allergy & Precautions, H&P , NPO status , Patient's Chart, lab work & pertinent test results  Airway Mallampati: II  TM Distance: >3 FB Neck ROM: full    Dental no notable dental hx.    Pulmonary neg pulmonary ROS,    Pulmonary exam normal        Cardiovascular negative cardio ROS Normal cardiovascular exam     Neuro/Psych negative neurological ROS  negative psych ROS   GI/Hepatic negative GI ROS, Neg liver ROS,   Endo/Other  negative endocrine ROSdiabetesMorbid obesity  Renal/GU negative Renal ROS     Musculoskeletal   Abdominal (+) + obese,   Peds  Hematology negative hematology ROS (+)   Anesthesia Other Findings   Reproductive/Obstetrics (+) Pregnancy                             Anesthesia Physical Anesthesia Plan  ASA: III  Anesthesia Plan: Epidural   Post-op Pain Management:    Induction:   Airway Management Planned:   Additional Equipment:   Intra-op Plan:   Post-operative Plan:   Informed Consent: I have reviewed the patients History and Physical, chart, labs and discussed the procedure including the risks, benefits and alternatives for the proposed anesthesia with the patient or authorized representative who has indicated his/her understanding and acceptance.     Plan Discussed with:   Anesthesia Plan Comments:         Anesthesia Quick Evaluation

## 2016-06-08 NOTE — Addendum Note (Signed)
Addendum  created 06/08/16 2229 by Collier FlowersElizabeth J Ninfa Giannelli, CRNA   Anesthesia Intra Meds edited

## 2016-06-08 NOTE — Progress Notes (Signed)
Called Arlan OrganDaniela Paul CNM regarding pt's BP 149/100 @ 2215. No S&S PIH. To recheck BP in 1 hour. Order for parameters to call received. Will continue to monitor.

## 2016-06-08 NOTE — Brief Op Note (Signed)
06/06/2016 - 06/08/2016  7:34 PM  PATIENT:  Michelle [name]  25 y.o. female  PRE-OPERATIVE DIAGNOSIS:  arrest of dilation. Oligohydramnios, Class A2 GDM, term gestation  POST-OPERATIVE DIAGNOSIS:  arrest of dilation, oligohydramnios, Class A2 GDM, term gestation  PROCEDURE:  Primary Cesarean section, kerr hysterotomy  SURGEON:  Surgeon(s) and Role:    * Maxie BetterSheronette Shimon Trowbridge, MD - Primary  PHYSICIAN ASSISTANT:   ASSISTANTS: Personnel officerHeather Krietemeyer, RNFA   ANESTHESIA:   epidural   FINDINGS:  LOT, live female, nl tubes and ovaries, left paratubal cyst, post placenta EBL:  Total I/O In: 1700 [I.V.:1700] Out: 900 [Urine:350; Blood:550]  BLOOD ADMINISTERED:none  DRAINS: none   LOCAL MEDICATIONS USED:  MARCAINE     SPECIMEN:  Source of Specimen:  placenta  DISPOSITION OF SPECIMEN:  PATHOLOGY  COUNTS:  YES  TOURNIQUET:  * No tourniquets in log *  DICTATION: .Other Dictation: Dictation Number [id number]  PLAN OF CARE: Admit to inpatient   PATIENT DISPOSITION:  PACU - hemodynamically stable.   Delay start of Pharmacological VTE agent (>24hrs) due to surgical blood loss or risk of bleeding: no

## 2016-06-08 NOTE — Transfer of Care (Signed)
Immediate Anesthesia Transfer of Care Note  Patient: Michelle [name]  Procedure(s) Performed: Procedure(s): CESAREAN SECTION (N/A)  Patient Location: PACU  Anesthesia Type:Epidural  Level of Consciousness: awake, alert , oriented and patient cooperative  Airway & Oxygen Therapy: Patient Spontanous Breathing  Post-op Assessment: Report given to RN and Post -op Vital signs reviewed and stable  Post vital signs: Reviewed and stable  Last Vitals:  TEMP 97.7 BP 94/48 HR 97 RR 15 POX 97  Last Pain: 0 Pain goal: 4       Complications: none

## 2016-06-08 NOTE — Anesthesia Postprocedure Evaluation (Signed)
Anesthesia Post Note  Patient: Michelle [name]  Procedure(s) Performed: Procedure(s) (LRB): CESAREAN SECTION (N/A)  Patient location during evaluation: PACU Anesthesia Type: Epidural Level of consciousness: oriented and awake and alert Pain management: pain level controlled Vital Signs Assessment: post-procedure vital signs reviewed and stable Respiratory status: spontaneous breathing, respiratory function stable and patient connected to nasal cannula oxygen Cardiovascular status: blood pressure returned to baseline and stable Postop Assessment: no headache, no backache, epidural receding and patient able to bend at knees Anesthetic complications: no     Last Vitals:  Vitals:   06/08/16 2000 06/08/16 2015  BP: (!) 86/49 105/66  Pulse: (!) 102 100  Resp: 20 19  Temp:      Last Pain:  Vitals:   06/08/16 2015  TempSrc:   PainSc: 0-No pain   Pain Goal:                 Shristi Scheib JENNETTE

## 2016-06-08 NOTE — Anesthesia Pain Management Evaluation Note (Signed)
  CRNA Pain Management Visit Note  Patient: Michelle DibbleKhristina L Stamps, 25 y.o., female  "Hello I am a member of the anesthesia team at [facility]. We have an anesthesia team available at all times to provide care throughout the hospital, including epidural management and anesthesia for C-section. I don't know your plan for the delivery whether it a natural birth, water birth, IV sedation, nitrous supplementation, doula or epidural, but we want to meet your pain goals."   1.Was your pain managed to your expectations on prior hospitalizations?   No prior hospitalizations  2.What is your expectation for pain management during this hospitalization?     Epidural  3.How can we help you reach that goal? unsure  Record the patient's initial score and the patient's pain goal.   Pain: 3  Pain Goal: 5 [facility] wants you to be able to say your pain was always managed very well.  Cephus ShellingBURGER,Mclain Freer 06/08/2016

## 2016-06-08 NOTE — Anesthesia Procedure Notes (Signed)
Epidural Patient location during procedure: OB Start time: 06/08/2016 3:47 PM End time: 06/08/2016 3:51 PM  Staffing Anesthesiologist: Leilani AbleHATCHETT, Freddy Kinne Performed: anesthesiologist   Preanesthetic Checklist Completed: patient identified, surgical consent, pre-op evaluation, timeout performed, IV checked, risks and benefits discussed and monitors and equipment checked  Epidural Patient position: sitting Prep: site prepped and draped and DuraPrep Patient monitoring: continuous pulse ox and blood pressure Approach: midline Location: L3-L4 Injection technique: LOR air  Needle:  Needle type: Tuohy  Needle gauge: 17 G Needle length: 9 cm and 9 Needle insertion depth: 6 cm Catheter type: closed end flexible Catheter size: 19 Gauge Catheter at skin depth: 11 cm Test dose: negative and Other  Assessment Sensory level: T9 Events: blood not aspirated, injection not painful, no injection resistance, negative IV test and no paresthesia  Additional Notes Reason for block:procedure for pain

## 2016-06-08 NOTE — Progress Notes (Signed)
S: notes some lower abdominal pressure (+) FM Denies h/a, visual changes  O: pitocin 16 miu .BP (!) 145/90   Pulse 93   Temp 97.9 F (36.6 C) (Oral)   Resp 16   Ht 5' (1.524 m)   Wt 106.6 kg (235 lb)   BMI 45.90 kg/m  VE 4/50/-3 AROM clear fluid with some sl bood IUPC placed Tracing: baseline 145 (+) accel good variability Ctx a 2-6 mins  CBC    Component Value Date/Time   WBC 14.4 (H) 06/08/2016 0902   RBC 4.00 06/08/2016 0902   HGB 10.2 (L) 06/08/2016 0902   HCT 31.4 (L) 06/08/2016 0902   PLT 317 06/08/2016 0902   MCV 78.5 06/08/2016 0902   MCH 25.5 (L) 06/08/2016 0902   MCHC 32.5 06/08/2016 0902   RDW 16.1 (H) 06/08/2016 0902   CMP Latest Ref Rng & Units 06/08/2016 06/06/2016 04/24/2016  Glucose 65 - 99 mg/dL 161(W118(H) 960(A105(H) 540(J116(H)  BUN 6 - 20 mg/dL 5(L) - 7  Creatinine 8.110.44 - 1.00 mg/dL 9.14(N0.43(L) - 8.290.48  Sodium 135 - 145 mmol/L 135 - 133(L)  Potassium 3.5 - 5.1 mmol/L 3.8 - 3.7  Chloride 101 - 111 mmol/L 106 - 103  CO2 22 - 32 mmol/L 22 - 21(L)  Calcium 8.9 - 10.3 mg/dL 9.4 - 9.3  Total Protein 6.5 - 8.1 g/dL 6.9 - 6.5  Total Bilirubin 0.3 - 1.2 mg/dL 0.7 - 0.5  Alkaline Phos 38 - 126 U/L 133(H) - 87  AST 15 - 41 U/L 19 - 19  ALT 14 - 54 U/L 13(L) - 14   CBG (last 3)   Recent Labs  06/08/16 0420 06/08/16 0812 06/08/16 1217  GLUCAP 114* 115* 111*    IMP: Oligohydramnios Class A1 GDM Gest HTN Term GBS cx (+_) on IV PCN P) cont left exaggerated sims Cont Pitocin

## 2016-06-09 ENCOUNTER — Encounter (HOSPITAL_COMMUNITY): Payer: Self-pay | Admitting: Obstetrics and Gynecology

## 2016-06-09 DIAGNOSIS — D62 Acute posthemorrhagic anemia: Secondary | ICD-10-CM

## 2016-06-09 DIAGNOSIS — O139 Gestational [pregnancy-induced] hypertension without significant proteinuria, unspecified trimester: Secondary | ICD-10-CM | POA: Diagnosis present

## 2016-06-09 DIAGNOSIS — T50905A Adverse effect of unspecified drugs, medicaments and biological substances, initial encounter: Secondary | ICD-10-CM

## 2016-06-09 DIAGNOSIS — L298 Other pruritus: Secondary | ICD-10-CM | POA: Diagnosis not present

## 2016-06-09 DIAGNOSIS — O24419 Gestational diabetes mellitus in pregnancy, unspecified control: Secondary | ICD-10-CM | POA: Diagnosis present

## 2016-06-09 HISTORY — DX: Acute posthemorrhagic anemia: D62

## 2016-06-09 LAB — CBC
HEMATOCRIT: 25.4 % — AB (ref 36.0–46.0)
Hemoglobin: 8.5 g/dL — ABNORMAL LOW (ref 12.0–15.0)
MCH: 25.9 pg — AB (ref 26.0–34.0)
MCHC: 33.5 g/dL (ref 30.0–36.0)
MCV: 77.4 fL — AB (ref 78.0–100.0)
Platelets: 214 10*3/uL (ref 150–400)
RBC: 3.28 MIL/uL — ABNORMAL LOW (ref 3.87–5.11)
RDW: 16.2 % — AB (ref 11.5–15.5)
WBC: 21.2 10*3/uL — ABNORMAL HIGH (ref 4.0–10.5)

## 2016-06-09 LAB — CCBB MATERNAL DONOR DRAW

## 2016-06-09 MED ORDER — MAGNESIUM OXIDE 400 (241.3 MG) MG PO TABS
400.0000 mg | ORAL_TABLET | Freq: Every day | ORAL | Status: DC
  Administered 2016-06-09 – 2016-06-11 (×3): 400 mg via ORAL
  Filled 2016-06-09 (×4): qty 1

## 2016-06-09 MED ORDER — INFLUENZA VAC SPLIT QUAD 0.5 ML IM SUSY
0.5000 mL | PREFILLED_SYRINGE | INTRAMUSCULAR | Status: AC
Start: 2016-06-10 — End: 2016-06-10
  Administered 2016-06-10: 0.5 mL via INTRAMUSCULAR
  Filled 2016-06-09: qty 0.5

## 2016-06-09 MED ORDER — POLYSACCHARIDE IRON COMPLEX 150 MG PO CAPS
150.0000 mg | ORAL_CAPSULE | Freq: Two times a day (BID) | ORAL | Status: DC
  Administered 2016-06-09 – 2016-06-11 (×5): 150 mg via ORAL
  Filled 2016-06-09 (×6): qty 1

## 2016-06-09 NOTE — Anesthesia Postprocedure Evaluation (Signed)
Anesthesia Post Note  Patient: Michelle [name]  Procedure(s) Performed: Procedure(s) (LRB): CESAREAN SECTION (N/A)  Patient location during evaluation: Mother Baby Anesthesia Type: Epidural Level of consciousness: awake and alert Pain management: pain level controlled Vital Signs Assessment: post-procedure vital signs reviewed and stable Respiratory status: spontaneous breathing Cardiovascular status: stable Postop Assessment: no headache, no backache, epidural receding and patient able to bend at knees Anesthetic complications: no     Last Vitals:  Vitals:   06/09/16 0043 06/09/16 0445  BP: 130/75 116/68  Pulse: 90 88  Resp: 18 18  Temp: 36.3 C 36.6 C    Last Pain:  Vitals:   06/09/16 0627  TempSrc:   PainSc: 2    Pain Goal: Patients Stated Pain Goal: 2 (06/09/16 [id number])               Edison PaceWILKERSON,Drew Lips

## 2016-06-09 NOTE — Addendum Note (Signed)
Addendum  created 06/09/16 0751 by Earmon PhoenixValerie P Milferd Ansell, CRNA   Sign clinical note

## 2016-06-09 NOTE — Lactation Note (Signed)
This note was copied from a baby's chart. Lactation Consultation Note  P1, 17 hours old.  Baby recently breastfed for 20 min on R side. Reviewed hand expression and drops expressed bilaterally. Nipples semi flat so provided a manual pump to evert before latching. Assisted with compressing breast to help latch baby in football hold. A few swallows observed w/ stimulation but baby sleepy after earlier feeding. Discussed basics.  Reviewed baby and me booklet. Mom encouraged to feed baby 8-12 times/24 hours and with feeding cues.  Mom made aware of O/P services, breastfeeding support groups, community resources, and our phone # for post-discharge questions.    Patient Name: Michelle [name] ZOXWR'UToday's Date: 06/09/2016 Reason for consult: Initial assessment   Maternal Data Has patient been taught Hand Expression?: Yes Does the patient have breastfeeding experience prior to this delivery?: No  Feeding Feeding Type: Breast Fed Length of feed: 20 min  LATCH Score/Interventions Latch: Grasps breast easily, tongue down, lips flanged, rhythmical sucking.  Audible Swallowing: A few with stimulation  Type of Nipple: Everted at rest and after stimulation  Comfort (Breast/Nipple): Soft / non-tender     Hold (Positioning): Assistance needed to correctly position infant at breast and maintain latch. Intervention(s): Support Pillows  LATCH Score: 8  Lactation Tools Discussed/Used     Consult Status Consult Status: Follow-up Date: 06/10/16 Follow-up type: In-patient    Dahlia ByesBerkelhammer, Ruth [facility] 06/09/2016, 12:21 PM

## 2016-06-09 NOTE — Progress Notes (Signed)
Patient ID: Michelle ArtistKhristina L Andis, female   DOB: [birth year], 25 y.o.   MRN: [id number] Subjective: S/P Primary Cesarean Delivery for Arrest of dilatation / Class A2 Gestational Diabetes / Oligohydramnios POD# 1 Information for the patient's newborn:  Jim LikeCrutchfield, Boy Alinna [[id number]][[id number]]  female  / circ planning  Reports feeling well. C/O increased itching all over. Feeding: breast Patient reports tolerating PO.  Breast symptoms: none Pain controlled with acetaminophen and ibuprofen (OTC) Denies HA/SOB/C/P/N/V/dizziness. Flatus present. No BM. She reports vaginal bleeding as normal, without clots.  She is ambulating, urinating without difficulty.     Objective:   VS:  Vitals:   06/08/16 2330 06/09/16 0043 06/09/16 0445 06/09/16 0910  BP: (!) 147/90 130/75 116/68 122/84  Pulse: 92 90 88 86  Resp: 18 18 18    Temp: 97.3 F (36.3 C) 97.4 F (36.3 C) 97.8 F (36.6 C) 97.7 F (36.5 C)  TempSrc: Axillary Axillary Oral Oral  SpO2: 96% 95%  98%  Weight:      Height:          Intake/Output Summary (Last 24 hours) at 06/09/16 0931 Last data filed at 06/09/16 [id number]  Gross per 24 hour  Intake           4722.5 ml  Output             3650 ml  Net           1072.5 ml         Recent Labs  06/08/16 2035 06/09/16 0612  WBC 23.2* 21.2*  HGB 9.0* 8.5*  HCT 27.5* 25.4*  PLT 232 214     Blood type: A/Positive/-- (09/11 0000)  Rubella: Immune (09/11 0000)     Physical Exam:   General: alert, cooperative, no distress and mildly obese  CV: Regular rate and rhythm, S1S2 present or without murmur or extra heart sounds  Resp: clear  Abdomen: soft, nontender, normal bowel sounds  Incision: clean, dry, intact and skin well-approximated with sutures  Uterine Fundus: firm, 1 FB below umbilicus, nontender  Lochia: minimal  Ext: edema trace and Homans sign is negative, no sign of DVT   Assessment/Plan: 25 y.o.   POD# 1.  S/P Cesarean Delivery.  Indications: Arrest of dilatation,  class A2 gestational                Principal Problem:   Postpartum care following cesarean delivery (9/13) Active Problems:   Oligohydramnios antepartum   Gestational diabetes mellitus (GDM-A2)   Gestational hypertension - stable BPs   Postoperative anemia due to acute blood loss   Itching due to drug  Doing well, stable.               Regular diet as tolerated D/C IV per unit protocol Encouraged to increase water intake Start 1% Hydrocortisone cream to affected areas TID Ambulate in hallways 2-3x/day Routine post-op care  Kenard GowerAWSON, Jaskirat Zertuche, M, MSN, CNM 06/09/2016, 9:06 AM

## 2016-06-09 NOTE — Op Note (Signed)
NAMEICELA, GLYMPH NO.:  [id number]  MEDICAL RECORD NO.:  [id number]  LOCATION:  9147                          FACILITY:  [facility]  PHYSICIAN:  Maxie Better, M.D.DATE OF BIRTH:  1990-11-04  DATE OF PROCEDURE:  06/08/2016 DATE OF DISCHARGE:                              OPERATIVE REPORT   PREOPERATIVE DIAGNOSIS:  Arrest of dilatation, class A2 gestational diabetes ,oligohydramnios, term gestation.  PROCEDURES:  Primary cesarean section, Kerr hysterotomy.  POSTOPERATIVE DIAGNOSIS:  Arrest of dilatation, class A2 gestational Diabetes, oligohydramnios term gestation.  ANESTHESIA:  Epidural.  SURGEON:  Maxie Better, M.D.  ASSISTANT:  Webb Silversmith, RNFA.  DESCRIPTION OF PROCEDURE:  Under adequate epidural anesthesia, the patient was placed in the supine position with a left lateral tilt.  An indwelling Foley catheter was already in place.  A Traixit was placed on the abdomen, which had a small pannus.  The patient was then sterilely prepped and draped in the usual fashion.  0.25% Marcaine was injected along the planned Pfannenstiel skin incision site.  Pfannenstiel skin incision was then made, carried down to the rectus fascia.  The rectus fascia was opened transversely.  The rectus fascia was then bluntly and sharply dissected off the rectus muscle in superior and inferior fashion.  The rectus muscle split in the midline.  The parietal peritoneum was entered bluntly and extended.  Serous appearing fluid was noted in the abdomen.  A self-retaining Alexis retractor was placed. The uterus was dextrorotated to the left.  It was positioned in the center.  A vesicouterine peritoneum was opened transversely.  The bladder was bluntly dissected off the lower uterine segment displaced inferiorly.  A bladder retractor was used to help with the narrow pelvis.  A curvilinear low transverse uterine incision was then made and extended with bandage scissors.   At that point, it was noted that the baby was in the left occiput transverse position with a small caput. The baby was delivered.  Bulb suctioned on the abdomen.  Cord was delayed for a minute, clamped, cut and the baby was subsequently transferred to the awaiting pediatricians who assigned Apgars 8 and 9 at 1 and 5 minutes.  The placenta which was posterior was manually removed after cord blood collection had been obtained for donation.  The uterine cavity was cleaned of debris.  Uterus was atonic.  Uterine incision was closed in 2 layers, the first with 0 Monocryl running lock stitch. Second layer was imbricated using 0 Monocryl suture.  Normal tubes and ovaries were noted bilaterally.  The left mesosalpinx had a paratubal cyst about 1 cm, which was left in place.  Massage of the uterus and IV Pitocin were given.  The uterus appeared a little bit flaccid and therefore intramuscular Hemabate was injected in the anterior body of the uterus.  The abdomen was then irrigated and suctioned of debris. Interceed was placed over the lower uterine segment in an inverted T fashion.  The parietal peritoneum was then closed with 2-0 Vicryl.  The rectus fascia was closed with 0 Vicryl x2.  The subcutaneous area was irrigated.  Small bleeders cauterized.  Interrupted 2-0 plain sutures placed and the skin approximated using 4-0 Vicryl suture.  Steri-Strips and benzoin were  then placed.  SPECIMENS:  Placenta not sent to Pathology.  ESTIMATED BLOOD LOSS:  550 ml.  URINE OUTPUT:  350 ml.  INTRAOPERATIVE FLUID:  2500 mL.  SPONGE AND INSTRUMENT COUNT:  Counts x2 was correct.  COMPLICATION:  None.  The patient tolerated the procedure well and was transferred to recovery in stable condition.     Maxie BetterSheronette Tarina Volk, M.D.     [place]/MEDQ  D:  06/08/2016  T:  06/09/2016  Job:  [id number]

## 2016-06-10 LAB — BIRTH TISSUE RECOVERY COLLECTION (PLACENTA DONATION)

## 2016-06-10 NOTE — Lactation Note (Signed)
This note was copied from a baby's chart. Lactation Consultation Note  Patient Name: Michelle Graylin ShiverKhristina Mix Today's Date: 06/10/2016 Reason for consult: Follow-up assessment;Difficult latch   Follow up with first time mom of 46 hour old infant. Infant with 3 BF for 10-20 minutes, 2 attempts, 5 bottle feeds of 10-25 cc, 3 voids and 7 stools in 24 hours preceding this assessment. Infant weight 8 lb 2.7 oz with 3 % weight loss since birth. LATCH Scores 5-9 by bedside RN's.  Infant was awake and alert when I went into room, mom requesting assistance with feeding. Assisted mom in latching infant to right breast in football hold, he nursed for 5 minutes and fell asleep. We then hand expressed and fed infant 6 cc EBM via spoon, he then latched in cross cradle hold to right breast and fed for an additional 10 minutes before going to sleep. He then was swaddled to be placed in crib for mom to pump and awakened, showing feeding cues. We then relatched him to the right breast in the football hold and he fed for an additional 5 minutes before falling asleep. Enc mom to stimulate infant at breast as needed to maintain feeding. When infant was latched he was noted to have flanged lips, rhythmic suckling and intermittent swallows. Swallows increased with breast compression.   Mom with large pendulous breasts and soft compressible tissue and nipples. Nipples are a semiflat and evert well with stimulation. Colostrum easily expressible with hand expression.   Set up DEBP for mom to begin pumping after BF or if infant wont latch or is sleepy at the breast to encourage milk to come in and to give infant supplement from the breast. Enc mom to use her EBM first to supplement infant and then offer formula as needed. Mom was shown how to set up, assemble pump parts, disassemble, and clean pump parts. Mom is a [employer], [employer] employee and UMR pump was given to mom today.   Plan: Breastfeed infant 8-12 x in 24 hours at first feeding  cues Hand express and spoon feed infant is sleepy prior to BF Pump post BF on Initiate setting for 15 minutes every 2-3 hours Hand express post pumping Feed all EBM to infant prior to using formula Call for assistance with latch as needed Follow up tomorrow and prn.       Maternal Data Has patient been taught Hand Expression?: Yes Does the patient have breastfeeding experience prior to this delivery?: No  Feeding Feeding Type: Breast Fed Length of feed: 15 min  LATCH Score/Interventions Latch: Repeated attempts needed to sustain latch, nipple held in mouth throughout feeding, stimulation needed to elicit sucking reflex. Intervention(s): Skin to skin;Teach feeding cues;Waking techniques Intervention(s): Breast compression;Breast massage;Assist with latch;Adjust position  Audible Swallowing: Spontaneous and intermittent Intervention(s): Skin to skin;Hand expression Intervention(s): Alternate breast massage;Hand expression;Skin to skin  Type of Nipple: Everted at rest and after stimulation Intervention(s): Reverse pressure  Comfort (Breast/Nipple): Soft / non-tender     Hold (Positioning): Assistance needed to correctly position infant at breast and maintain latch. Intervention(s): Breastfeeding basics reviewed;Support Pillows;Position options;Skin to skin  LATCH Score: 8  Lactation Tools Discussed/Used WIC Program: No Pump Review: Setup, frequency, and cleaning;Milk Storage Initiated by:: Noralee StainSharon Jenice Leiner, RN, IBCLC Date initiated:: 06/10/16   Consult Status Consult Status: Follow-up Date: 06/11/16 Follow-up type: In-patient    Silas FloodSharon S Chenise Mulvihill 06/10/2016, 6:28 PM

## 2016-06-10 NOTE — Progress Notes (Signed)
POSTOPERATIVE DAY # 2 S/P CS for arrest of dilation / GDMa2    S:         Reports feeling sore today             Tolerating po intake / no nausea / no vomiting / + flatus / no BM             Bleeding is light             Pain controlled with motrin and oxycodone             Up ad lib / ambulatory/ voiding QS  Newborn breast feeding   O:  VS: BP 134/73 (BP Location: Right Arm)   Pulse 93   Temp 98.5 F (36.9 C) (Oral)   Resp 16   Ht 5' (1.524 m)   Wt 106.6 kg (235 lb)   SpO2 100%   Breastfeeding? Unknown   BMI 45.90 kg/m    LABS:               Recent Labs  06/08/16 2035 06/09/16 0612  WBC 23.2* 21.2*  HGB 9.0* 8.5*  PLT 232 214               Bloodtype: A/Positive/-- (09/11 0000)  Rubella: Immune (09/11 0000)                                tdap 2017                         I&O: Intake/Output      09/14 0701 - 09/15 0700 09/15 0701 - 09/16 0700   P.O.     I.V. (mL/kg) 260.4 (2.4)    Total Intake(mL/kg) 260.4 (2.4)    Urine (mL/kg/hr) 1100 (0.4)    Blood     Total Output 1100     Net -839.6                     Physical Exam:             Alert and Oriented X3  Lungs: Clear and unlabored  Heart: regular rate and rhythm / no mumurs  Abdomen: soft, non-tender, non-distended, active BS             Fundus: firm, non-tender, Ueven             Dressing intact honeycomb              Incision:  no erythema / no ecchymosis / no drainage  Perineum: intact  Lochia: light  Extremities: trace edema, no calf pain or tenderness, negative Homans  A:        POD # 2 S/P CS - arrest of dilation            GDMa2 - delivered            IDA of pregnancy with compounded by ABL   P:        Routine postoperative care              Anticipate DC tomorrow    Marlinda MikeBAILEY, TANYA CNM, MSN, [facility] 06/10/2016, 11:08 AM

## 2016-06-11 MED ORDER — OXYCODONE-ACETAMINOPHEN 5-325 MG PO TABS: 1.0000 | ORAL_TABLET | ORAL | 0 refills | Status: AC | PRN

## 2016-06-11 MED ORDER — POLYSACCHARIDE IRON COMPLEX 150 MG PO CAPS: 150.0000 mg | ORAL_CAPSULE | Freq: Two times a day (BID) | ORAL | 0 refills | Status: AC

## 2016-06-11 MED ORDER — MAGNESIUM OXIDE 400 (241.3 MG) MG PO TABS: 400.0000 mg | ORAL_TABLET | Freq: Every day | ORAL | 0 refills | Status: AC

## 2016-06-11 NOTE — Lactation Note (Signed)
This note was copied from a baby's chart. Lactation Consultation Note  Patient Name: Michelle [name] Today's Date: 06/11/2016 Reason for consult: Follow-up assessment (recently fed at 0930 )  Baby is 4964 hour old and has been breast and formula - per moms preference. LC reviewed with mom supply and demand - and recommended for abby top get  Lots of practice at the breast . If still hungry either re-latch or keep supplementing low.  Sore nipple and engorgement prevention and tx reviewed.  Per mom has  DEBP at home.  Resource Baby and me pages 24 -25.  Mother informed of post-discharge support and given phone number to the lactation department,  including services for phone call assistance; out-patient appointments; and breastfeeding support  group. List of other breastfeeding resources in the community given in the handout. Encouraged mother to call for problems or concerns related to breastfeeding.   Maternal Data Has patient been taught Hand Expression?:  (per mom feels comfortable )  Feeding    LATCH Score/Interventions                Intervention(s): Breastfeeding basics reviewed     Lactation Tools Discussed/Used Pump Review: Milk Storage   Consult Status Consult Status: Complete Date: 06/11/16    [name], Debie Ashline Ann 06/11/2016, 11:24 AM

## 2016-06-11 NOTE — Discharge Summary (Signed)
OB Discharge Summary  Patient Name: Michelle ArtistKhristina L Silbaugh DOB: 12/30/1990 MRN: [id number]  Date of admission: 06/06/2016  Admitting diagnosis: Intrauterine pregnancy: 38 5/7wk/oligohydramnios Secondary diagnosis: Class A2 GDM   Date of discharge: 06/11/2016     Discharge diagnosis: Term Pregnancy Delivered, GDM A2, Anemia and POD 3 from CS for arrest of dilation      Prenatal history: G1P1001   EDC : 06/15/2016, Alternate EDD Entry  [facility]  Primary provider : Kerby Hockley Prenatal course complicated by GDMa2  Prenatal Labs: ABO, Rh: A/Positive/-- (09/11 0000)  Antibody: Negative (09/11 0000) Rubella: Immune (09/11 0000)   RPR: Non Reactive (09/11 1835)  HBsAg: Negative (09/11 0000)  HIV: Non-reactive (09/11 0000)  GBS: Positive (09/11 0000)                                    Hospital course:  Induction of Labor With Cesarean Section  25 y.o. yo G1P1001 at 5949w0d was admitted to the hospital 06/06/2016 for induction of labor. Pt was induced with oral cytotec, intracervical balloon and pitocin induction with arom. Patient had a labor course significant for arrest of dilation at 4cm with edematous cervix. The patient went for cesarean section due to Arrest of Dilation, and delivered a Viable infant) Membrane Rupture Time/Date: )1:01 PM ,06/08/2016  Details of operation can be found in separate operative Note.  Patient had an uncomplicated postpartum course. She is ambulating, tolerating a regular diet, passing flatus, and urinating well.  Patient is discharged home in stable condition on 06/11/16.                                    Augmentation: Pitocin Delivering PROVIDER: Vyncent Overby                                                            Complications: None  Newborn Data: Live born female  Birth Weight: 8 lb 6.6 oz (3815 g) APGAR: 9, 9  Baby Feeding: Breast Disposition:home with mother  Post partum  procedures:none  Postpartum contraception: Not Discussed    Labs: Lab Results  Component Value Date   WBC 21.2 (H) 06/09/2016   HGB 8.5 (L) 06/09/2016   HCT 25.4 (L) 06/09/2016   MCV 77.4 (L) 06/09/2016   PLT 214 06/09/2016   CMP Latest Ref Rng & Units 06/08/2016  Glucose 65 - 99 mg/dL 132(G118(H)  BUN 6 - 20 mg/dL 5(L)  Creatinine 4.010.44 - 1.00 mg/dL 0.27(O0.43(L)  Sodium 536135 - 644145 mmol/L 135  Potassium 3.5 - 5.1 mmol/L 3.8  Chloride 101 - 111 mmol/L 106  CO2 22 - 32 mmol/L 22  Calcium 8.9 - 10.3 mg/dL 9.4  Total Protein 6.5 - 8.1 g/dL 6.9  Total Bilirubin 0.3 - 1.2 mg/dL 0.7  Alkaline Phos 38 - 126 U/L 133(H)  AST 15 - 41 U/L 19  ALT 14 - 54 U/L 13(L)    Physical Exam @ time of discharge:  Vitals:   06/09/16 1725 06/10/16 0632 06/10/16 1837 06/11/16 0500  BP: 111/69 134/73 (!) 135/96 130/85  Pulse: 78 93 (!) 102  85  Resp: 20 16 18 18   Temp: 98 F (36.7 C) 98.5 F (36.9 C) 98.3 F (36.8 C) 98.4 F (36.9 C)  TempSrc: Oral Oral Oral Oral  SpO2: 100%     Weight:      Height:        General: alert, cooperative and no distress Lochia: appropriate Uterine Fundus: firm Perineum: intact Incision: Healing well with no significant drainage Extremities: DVT Evaluation: No evidence of DVT seen on physical exam.   Discharge instructions:  "Baby and Me Booklet" and [facility] Booklet  Discharge Medications:    Medication List    TAKE these medications   iron polysaccharides 150 MG capsule Commonly known as:  NIFEREX Take 1 capsule (150 mg total) by mouth 2 (two) times daily.   magnesium oxide 400 (241.3 Mg) MG tablet Commonly known as:  MAG-OX Take 1 tablet (400 mg total) by mouth daily. Start taking on:  06/12/2016   oxyCODONE-acetaminophen 5-325 MG tablet Commonly known as:  PERCOCET/ROXICET Take 1 tablet by mouth every 4 (four) hours as needed (pain scale 4-7).       Diet: routine diet  Activity: Advance as tolerated. Pelvic rest x 6 weeks.   Follow up:6 weeks  / 2hr GTT at 6-12 weeks PP    Signed: Marlinda Mike CNM, MSN, [facility] 06/11/2016, 11:11 AM

## 2016-06-11 NOTE — Progress Notes (Signed)
POSTOPERATIVE DAY # 3 S/P CS   S:         Reports feeling ok             Tolerating po intake / no nausea / no vomiting / + flatus / no BM             Bleeding is light             Pain controlled with motrin and percocet             Up ad lib / ambulatory/ voiding QS  Newborn breast feeding   O:  VS: BP 130/85 (BP Location: Right Arm)   Pulse 85   Temp 98.4 F (36.9 C) (Oral)   Resp 18   Ht 5' (1.524 m)   Wt 106.6 kg (235 lb)   SpO2 100%   Breastfeeding? Unknown   BMI 45.90 kg/m    LABS:               Recent Labs  06/08/16 2035 06/09/16 0612  WBC 23.2* 21.2*  HGB 9.0* 8.5*  PLT 232 214               Bloodtype: A/Positive/-- (09/11 0000)  Rubella: Immune (09/11 0000)                                Physical Exam:             Alert and Oriented X3  Lungs: Clear and unlabored  Heart: regular rate and rhythm / no mumurs  Abdomen: soft, non-tender, non-distended active BS             Fundus: firm, non-tender, Ueven             Dressing intact              Incision:  approximated with suture / no erythema / no ecchymosis / no drainage  Perineum: intact  Lochia: light  Extremities: 1+ edema, no calf pain or tenderness, negative Homans  A:        POD # 3 S/P CS            GDMa2            IDA  P:        Routine postoperative care              Dc home     Marlinda MikeBAILEY, Janeice Stegall CNM, MSN, [facility] 06/11/2016, 11:07 AM

## 2016-06-12 ENCOUNTER — Encounter (HOSPITAL_COMMUNITY): Payer: Self-pay | Admitting: *Deleted

## 2016-06-12 ENCOUNTER — Inpatient Hospital Stay (HOSPITAL_COMMUNITY)
Admission: AD | Admit: 2016-06-12 | Discharge: 2016-06-12 | Disposition: A | Discharge status: Home / Self Care | Payer: 59 | Source: Ambulatory Visit | Attending: Obstetrics and Gynecology | Admitting: Obstetrics and Gynecology

## 2016-06-12 DIAGNOSIS — O1205 Gestational edema, complicating the puerperium: Secondary | ICD-10-CM | POA: Diagnosis not present

## 2016-06-12 NOTE — MAU Note (Signed)
Pt had c/s Wednesday and was discharged yesterday afternoon. Pt is c/o left ankle swelling that she noticed today. States that she has slight pain with it that she rates 2/10. Also has a mild HA that she rates 2/10. Denies vision changes or pain in RUQ. States that she did have GDM and GHTN with this pregnancy.

## 2016-06-12 NOTE — MAU Provider Note (Signed)
History     Chief Complaint  Patient presents with  . Postpartum Complications   Cc. Ankle swelling HPI: 25 yo G1P1 BF POD #4 sent in for evaluation by hospital RN line c/o ankle swelling. Pt denies epigastric pain. Mild h/a not requiring Any medication. No visual changes. Breast and bottle feeding ok  OB History    Gravida Para Term Preterm AB Living   1 1 1     1    SAB TAB Ectopic Multiple Live Births         0 1      Past Medical History:  Diagnosis Date  . Gestational diabetes mellitus, antepartum, unspecified diabetic control, unspecified trimester    Gestational  . Postoperative anemia due to acute blood loss 06/09/2016    Past Surgical History:  Procedure Laterality Date  . CESAREAN SECTION N/A 06/08/2016   Procedure: CESAREAN SECTION;  Surgeon: Maxie BetterSheronette Shiara Mcgough, MD;  Location: [facility];  Service: Obstetrics;  Laterality: N/A;  . NO PAST SURGERIES    . TOOTH EXTRACTION      History reviewed. No pertinent family history.  Social History  Substance Use Topics  . Smoking status: Never Smoker  . Smokeless tobacco: Never Used  . Alcohol use No    Allergies:  Allergies  Allergen Reactions  . Ibuprofen Hives    Prescriptions Prior to Admission  Medication Sig Dispense Refill Last Dose  . magnesium oxide (MAG-OX) 400 (241.3 Mg) MG tablet Take 1 tablet (400 mg total) by mouth daily. 90 tablet 0 Past Week at Unknown time  . oxyCODONE-acetaminophen (PERCOCET/ROXICET) 5-325 MG tablet Take 1 tablet by mouth every 4 (four) hours as needed (pain scale 4-7). 30 tablet 0 06/11/2016 at Unknown time  . iron polysaccharides (NIFEREX) 150 MG capsule Take 1 capsule (150 mg total) by mouth 2 (two) times daily. 45 capsule 0      Physical Exam   Blood pressure 129/78, pulse 92, temperature 98 F (36.7 C), temperature source Oral, resp. rate 16, height 5' (1.524 m), weight 100.2 kg (221 lb), unknown if currently breastfeeding.  General appearance: alert, cooperative  and no distress Abdomen: soft obese nontender. honeycomb dressing removed, incision (+) steristrip. no drainage, erythema or induration Extremities: edema trace ankle edema( l>r) ED Course  IMP:  Ankle/leg edema: nl postpartum findings P) resume routine postpartum care. Increased po fluid intake. Elevate feet. Make 6 wk pp appt with plan for birth control d/c home MDM   Mckinsey Keagle A, MD 10:20 PM 06/12/2016

## 2016-07-20 DIAGNOSIS — Z30430 Encounter for insertion of intrauterine contraceptive device: Secondary | ICD-10-CM | POA: Diagnosis not present

## 2016-07-20 DIAGNOSIS — Z130 Encounter for screening for diseases of the blood and blood-forming organs and certain disorders involving the immune mechanism: Secondary | ICD-10-CM | POA: Diagnosis not present

## 2016-09-09 DIAGNOSIS — Z118 Encounter for screening for other infectious and parasitic diseases: Secondary | ICD-10-CM | POA: Diagnosis not present

## 2016-09-09 DIAGNOSIS — Z124 Encounter for screening for malignant neoplasm of cervix: Secondary | ICD-10-CM | POA: Diagnosis not present

## 2016-09-09 DIAGNOSIS — O24419 Gestational diabetes mellitus in pregnancy, unspecified control: Secondary | ICD-10-CM | POA: Diagnosis not present

## 2016-09-09 DIAGNOSIS — Z113 Encounter for screening for infections with a predominantly sexual mode of transmission: Secondary | ICD-10-CM | POA: Diagnosis not present

## 2016-09-09 DIAGNOSIS — Z30431 Encounter for routine checking of intrauterine contraceptive device: Secondary | ICD-10-CM | POA: Diagnosis not present

## 2016-10-26 DIAGNOSIS — H5213 Myopia, bilateral: Secondary | ICD-10-CM | POA: Diagnosis not present

## 2016-11-09 DIAGNOSIS — L02413 Cutaneous abscess of right upper limb: Secondary | ICD-10-CM | POA: Diagnosis not present

## 2016-11-09 DIAGNOSIS — Z6841 Body Mass Index (BMI) 40.0 and over, adult: Secondary | ICD-10-CM | POA: Diagnosis not present

## 2016-11-09 DIAGNOSIS — M25421 Effusion, right elbow: Secondary | ICD-10-CM | POA: Diagnosis not present

## 2016-11-09 DIAGNOSIS — M25521 Pain in right elbow: Secondary | ICD-10-CM | POA: Diagnosis not present

## 2016-11-10 DIAGNOSIS — L0291 Cutaneous abscess, unspecified: Secondary | ICD-10-CM | POA: Diagnosis not present

## 2017-05-16 DIAGNOSIS — J45901 Unspecified asthma with (acute) exacerbation: Secondary | ICD-10-CM | POA: Diagnosis not present

## 2017-05-16 DIAGNOSIS — Z6841 Body Mass Index (BMI) 40.0 and over, adult: Secondary | ICD-10-CM | POA: Diagnosis not present

## 2017-05-16 DIAGNOSIS — R635 Abnormal weight gain: Secondary | ICD-10-CM | POA: Diagnosis not present

## 2017-05-16 DIAGNOSIS — H6983 Other specified disorders of Eustachian tube, bilateral: Secondary | ICD-10-CM | POA: Diagnosis not present

## 2017-05-16 DIAGNOSIS — R0789 Other chest pain: Secondary | ICD-10-CM | POA: Diagnosis not present

## 2017-08-17 DIAGNOSIS — N202 Calculus of kidney with calculus of ureter: Secondary | ICD-10-CM | POA: Diagnosis not present

## 2017-08-17 DIAGNOSIS — R1111 Vomiting without nausea: Secondary | ICD-10-CM | POA: Diagnosis not present

## 2017-08-17 DIAGNOSIS — R1110 Vomiting, unspecified: Secondary | ICD-10-CM | POA: Diagnosis not present

## 2017-08-17 DIAGNOSIS — R350 Frequency of micturition: Secondary | ICD-10-CM | POA: Diagnosis not present

## 2017-08-17 DIAGNOSIS — R3915 Urgency of urination: Secondary | ICD-10-CM | POA: Diagnosis not present

## 2017-08-17 DIAGNOSIS — R109 Unspecified abdominal pain: Secondary | ICD-10-CM | POA: Diagnosis not present

## 2017-08-17 DIAGNOSIS — N200 Calculus of kidney: Secondary | ICD-10-CM | POA: Diagnosis not present

## 2017-08-24 DIAGNOSIS — Z6841 Body Mass Index (BMI) 40.0 and over, adult: Secondary | ICD-10-CM | POA: Diagnosis not present

## 2017-08-24 DIAGNOSIS — Z0000 Encounter for general adult medical examination without abnormal findings: Secondary | ICD-10-CM | POA: Diagnosis not present

## 2017-08-24 DIAGNOSIS — Z23 Encounter for immunization: Secondary | ICD-10-CM | POA: Diagnosis not present

## 2017-08-24 DIAGNOSIS — Z8632 Personal history of gestational diabetes: Secondary | ICD-10-CM | POA: Diagnosis not present

## 2017-08-24 DIAGNOSIS — Z01419 Encounter for gynecological examination (general) (routine) without abnormal findings: Secondary | ICD-10-CM | POA: Diagnosis not present

## 2017-09-12 DIAGNOSIS — Z6841 Body Mass Index (BMI) 40.0 and over, adult: Secondary | ICD-10-CM | POA: Diagnosis not present

## 2017-09-29 DIAGNOSIS — Z6841 Body Mass Index (BMI) 40.0 and over, adult: Secondary | ICD-10-CM | POA: Diagnosis not present

## 2017-09-29 DIAGNOSIS — N200 Calculus of kidney: Secondary | ICD-10-CM | POA: Diagnosis not present

## 2017-10-10 DIAGNOSIS — Z6841 Body Mass Index (BMI) 40.0 and over, adult: Secondary | ICD-10-CM | POA: Diagnosis not present

## 2017-10-17 DIAGNOSIS — H5213 Myopia, bilateral: Secondary | ICD-10-CM | POA: Diagnosis not present

## 2017-11-10 DIAGNOSIS — L299 Pruritus, unspecified: Secondary | ICD-10-CM | POA: Diagnosis not present

## 2017-11-10 DIAGNOSIS — L810 Postinflammatory hyperpigmentation: Secondary | ICD-10-CM | POA: Diagnosis not present

## 2017-11-10 DIAGNOSIS — L209 Atopic dermatitis, unspecified: Secondary | ICD-10-CM | POA: Diagnosis not present

## 2017-11-23 DIAGNOSIS — Z6841 Body Mass Index (BMI) 40.0 and over, adult: Secondary | ICD-10-CM | POA: Diagnosis not present

## 2017-11-23 DIAGNOSIS — R310 Gross hematuria: Secondary | ICD-10-CM | POA: Diagnosis not present

## 2017-12-19 DIAGNOSIS — R400 Somnolence: Secondary | ICD-10-CM | POA: Diagnosis not present

## 2017-12-19 DIAGNOSIS — R0683 Snoring: Secondary | ICD-10-CM | POA: Diagnosis not present

## 2017-12-19 DIAGNOSIS — G479 Sleep disorder, unspecified: Secondary | ICD-10-CM | POA: Diagnosis not present

## 2018-02-20 DIAGNOSIS — Z6841 Body Mass Index (BMI) 40.0 and over, adult: Secondary | ICD-10-CM | POA: Diagnosis not present

## 2018-02-20 DIAGNOSIS — R0602 Shortness of breath: Secondary | ICD-10-CM | POA: Diagnosis not present

## 2018-02-20 DIAGNOSIS — Z113 Encounter for screening for infections with a predominantly sexual mode of transmission: Secondary | ICD-10-CM | POA: Diagnosis not present

## 2018-02-20 DIAGNOSIS — R7303 Prediabetes: Secondary | ICD-10-CM | POA: Diagnosis not present

## 2018-03-07 DIAGNOSIS — R0602 Shortness of breath: Secondary | ICD-10-CM | POA: Diagnosis not present

## 2018-03-07 DIAGNOSIS — R0600 Dyspnea, unspecified: Secondary | ICD-10-CM | POA: Diagnosis not present

## 2018-07-04 IMAGING — US US MFM FETAL BPP W/O NON-STRESS
1 series · 13 of 17 positions shown · non-contrast
Comparison: none

[Series 1: us mfm fetal bpp w/o non-stress · 17 acquisitions, 13 frames shown]
[im 1/17]
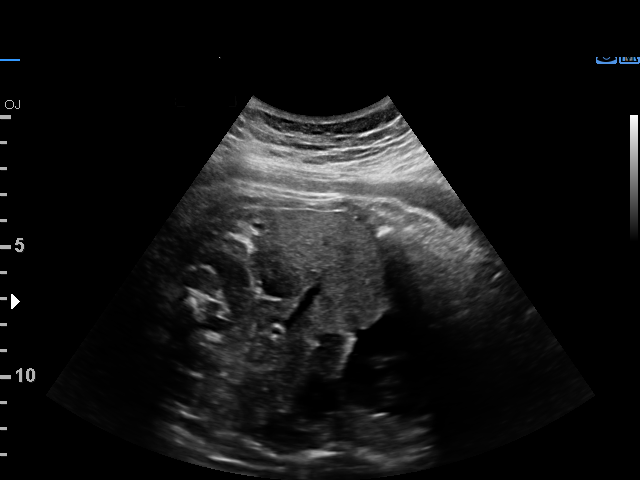
[im 2/17]
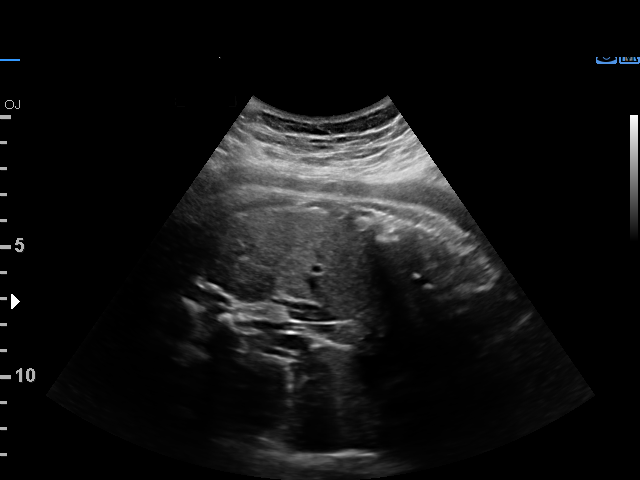
[im 4/17]
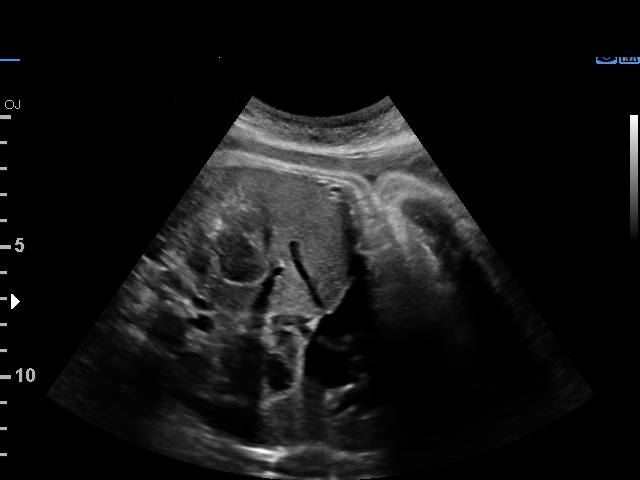
[im 5/17]
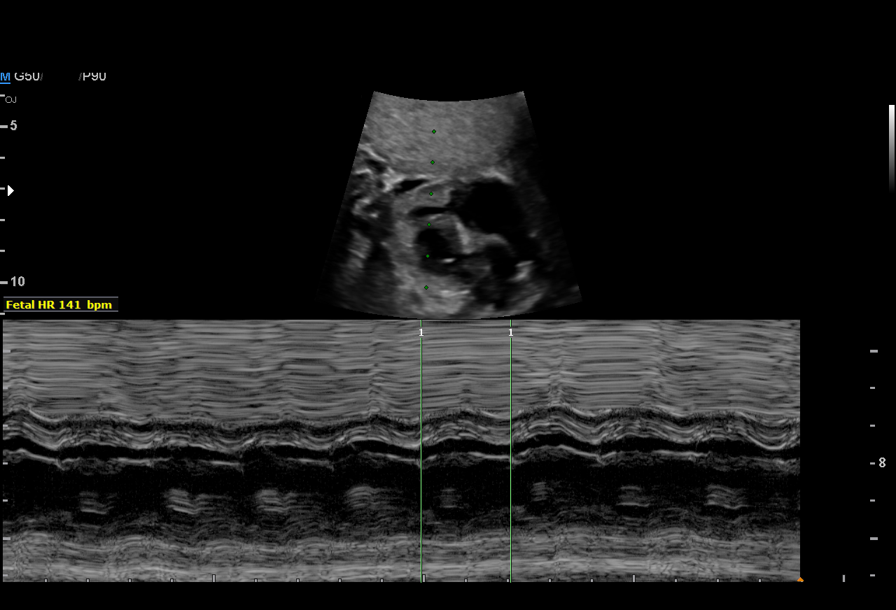
[im 6/17]
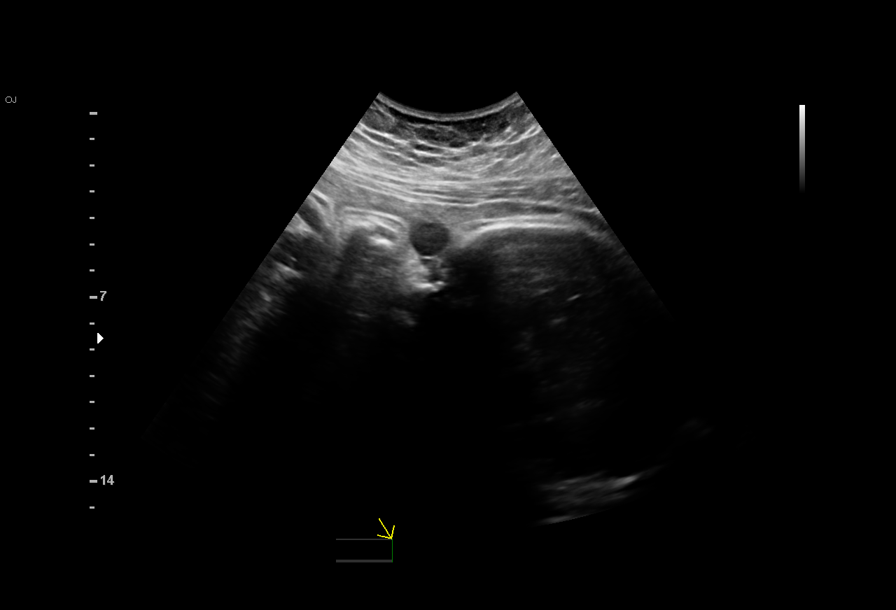
[im 8/17]
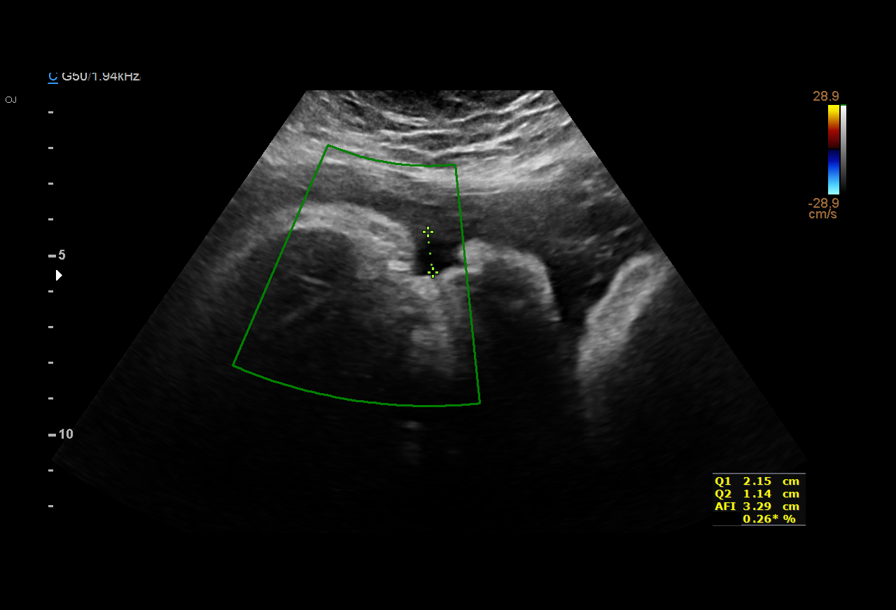
[im 9/17]
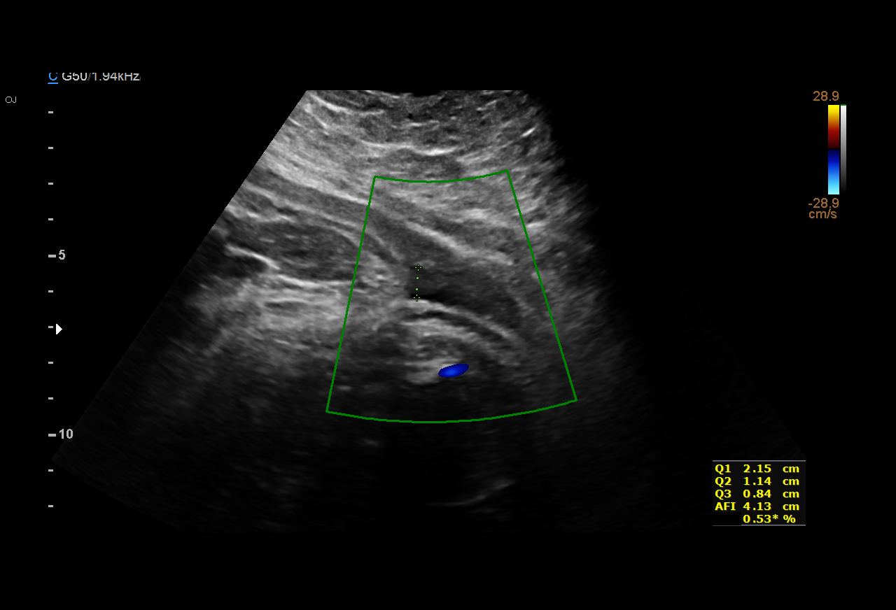
[im 10/17]
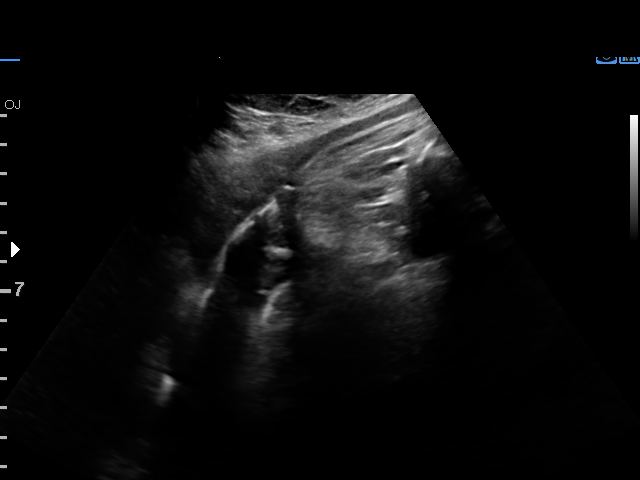
[im 12/17]
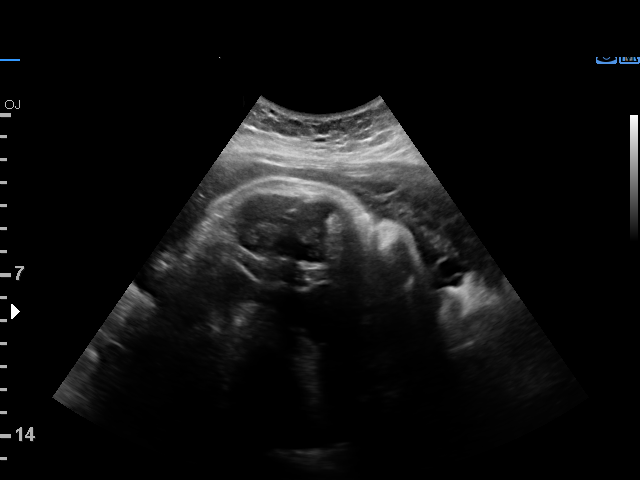
[im 13/17]
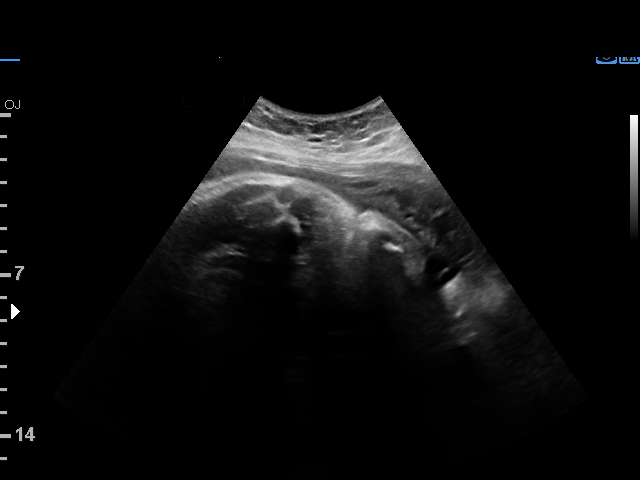
[im 14/17]
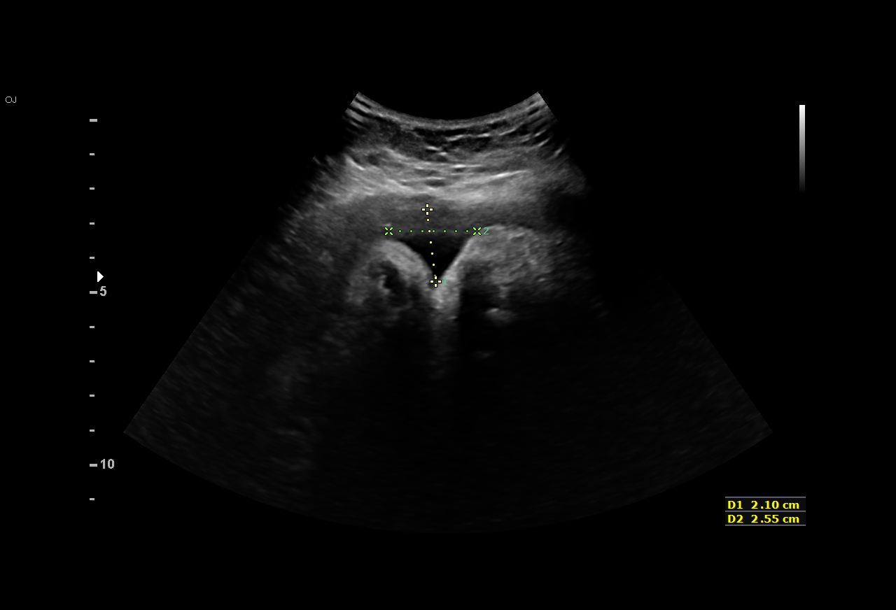
[im 16/17]
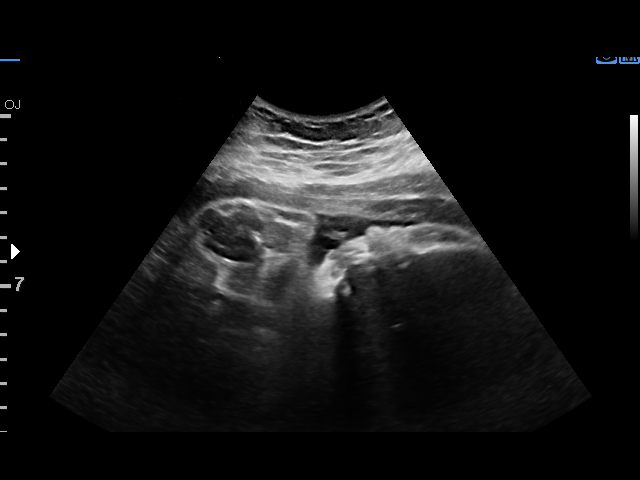
[im 17/17]
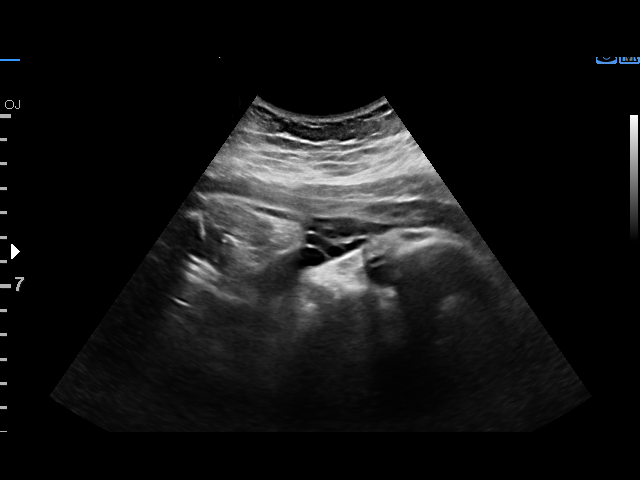

[13 of 17 positions shown; findings below may reference images not displayed]

[facility]

[facility]

1  ANGELIA               [phone]      [id number]     [id number]
NYAMASEKPOR
Indications

38 weeks gestation of pregnancy
Decreased fetal movement
OB History

Gravidity:    1         Term:   0        Prem:   0        SAB:   0
TOP:          0       Ectopic:  0        Living: 0
Fetal Evaluation

Num Of Fetuses:     1
Fetal Heart         141
Rate(bpm):
Cardiac Activity:   Observed
Presentation:       Cephalic

Amniotic Fluid
AFI FV:      Mild oligohydramnios

AFI Sum(cm)     %Tile       Largest Pocket(cm)
3.29            < 3

RUQ(cm)       RLQ(cm)
2.15
Biophysical Evaluation
Amniotic F.V:   Pocket => 2 cm two         F. Tone:        Observed
planes
F. Movement:    Observed                   Score:          [DATE]
F. Breathing:   Observed
Gestational Age

Clinical EDD:  38w 5d                                        EDD:   06/15/16
Best:          38w 5d    Det. By:   Clinical EDD             EDD:   06/15/16
Impression

SIUP at 38+5 weeks
Cephalic presentation
MIld oligohydramnios
BPP [DATE]
Recommendations

Follow-up as clinically indicated
